# Patient Record
Sex: Female | Born: 1965 | Race: White | Hispanic: No | Marital: Married | Smoking: Current every day smoker
Health system: Southern US, Community
[De-identification: ages and names within clinical notes are randomized; demographics above are authoritative.]

## PROBLEM LIST (undated history)

## (undated) DIAGNOSIS — J449 Chronic obstructive pulmonary disease, unspecified: Secondary | ICD-10-CM

## (undated) DIAGNOSIS — G4730 Sleep apnea, unspecified: Secondary | ICD-10-CM

## (undated) DIAGNOSIS — K219 Gastro-esophageal reflux disease without esophagitis: Secondary | ICD-10-CM

## (undated) DIAGNOSIS — R112 Nausea with vomiting, unspecified: Secondary | ICD-10-CM

## (undated) DIAGNOSIS — M797 Fibromyalgia: Secondary | ICD-10-CM

## (undated) DIAGNOSIS — F419 Anxiety disorder, unspecified: Secondary | ICD-10-CM

## (undated) DIAGNOSIS — J189 Pneumonia, unspecified organism: Secondary | ICD-10-CM

## (undated) DIAGNOSIS — Z9889 Other specified postprocedural states: Secondary | ICD-10-CM

## (undated) DIAGNOSIS — M1990 Unspecified osteoarthritis, unspecified site: Secondary | ICD-10-CM

## (undated) DIAGNOSIS — R51 Headache: Secondary | ICD-10-CM

## (undated) HISTORY — PX: TUBAL LIGATION: SHX77

## (undated) HISTORY — PX: DIAGNOSTIC LAPAROSCOPY: SUR761

## (undated) HISTORY — PX: ENDOMETRIAL ABLATION W/ NOVASURE: SUR434

## (undated) HISTORY — PX: KNEE ARTHROSCOPY: SUR90

---

## 2004-04-30 ENCOUNTER — Emergency Department: Payer: Self-pay | Admitting: Emergency Medicine

## 2004-08-04 ENCOUNTER — Emergency Department: Payer: Self-pay | Admitting: Emergency Medicine

## 2005-04-18 ENCOUNTER — Emergency Department: Payer: Self-pay | Admitting: Emergency Medicine

## 2006-04-05 ENCOUNTER — Emergency Department (HOSPITAL_COMMUNITY): Admission: EM | Admit: 2006-04-05 | Discharge: 2006-04-05 | Payer: Self-pay | Admitting: Emergency Medicine

## 2007-03-12 ENCOUNTER — Other Ambulatory Visit: Payer: Self-pay

## 2007-03-12 ENCOUNTER — Ambulatory Visit: Payer: Self-pay | Admitting: Obstetrics and Gynecology

## 2007-03-18 ENCOUNTER — Ambulatory Visit: Payer: Self-pay | Admitting: Obstetrics and Gynecology

## 2007-06-19 ENCOUNTER — Emergency Department: Payer: Self-pay | Admitting: Emergency Medicine

## 2008-03-10 ENCOUNTER — Emergency Department: Payer: Self-pay | Admitting: Emergency Medicine

## 2008-11-19 ENCOUNTER — Emergency Department: Payer: Self-pay

## 2009-03-11 ENCOUNTER — Emergency Department: Payer: Self-pay | Admitting: Emergency Medicine

## 2009-06-08 ENCOUNTER — Ambulatory Visit: Payer: Self-pay | Admitting: Unknown Physician Specialty

## 2009-06-09 ENCOUNTER — Ambulatory Visit: Payer: Self-pay | Admitting: Unknown Physician Specialty

## 2010-02-25 ENCOUNTER — Encounter
Admission: RE | Admit: 2010-02-25 | Discharge: 2010-02-25 | Payer: Self-pay | Source: Home / Self Care | Attending: Orthopedic Surgery | Admitting: Orthopedic Surgery

## 2011-03-28 ENCOUNTER — Emergency Department: Payer: Self-pay | Admitting: Emergency Medicine

## 2011-06-15 ENCOUNTER — Emergency Department: Payer: Self-pay | Admitting: Emergency Medicine

## 2011-06-15 LAB — PREGNANCY, URINE: Pregnancy Test, Urine: NEGATIVE m[IU]/mL

## 2011-06-15 LAB — URINALYSIS, COMPLETE
Bacteria: NONE SEEN
Bilirubin,UR: NEGATIVE
Blood: NEGATIVE
Leukocyte Esterase: NEGATIVE
Nitrite: NEGATIVE
Ph: 6 (ref 4.5–8.0)
Protein: NEGATIVE
RBC,UR: 1 /HPF (ref 0–5)
Squamous Epithelial: 3

## 2011-07-13 ENCOUNTER — Emergency Department: Payer: Self-pay | Admitting: Emergency Medicine

## 2012-01-18 ENCOUNTER — Emergency Department: Payer: Self-pay | Admitting: Internal Medicine

## 2012-05-21 ENCOUNTER — Emergency Department: Payer: Self-pay | Admitting: Emergency Medicine

## 2012-10-01 ENCOUNTER — Other Ambulatory Visit: Payer: Self-pay | Admitting: Orthopedic Surgery

## 2012-10-15 NOTE — H&P (Signed)
Courtland Krawiec is an 47 y.o. female.   Chief Complaint: Left Shoulder Pain  HPI: Patient returns with an MRI scan showing a small partial thickness of the left shoulder subscapularis tendon, supraspinatus tendinosis with an interstitial tear at the insertion, and most importantly, medial subluxation of the biceps tendon out of the groove.  She continues have pain and popping with overhead activities.  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  When she tries to do chores she has a significant amount of left shoulder pain again especially with overhead activities.  No past medical history on file.  No past surgical history on file.  No family history on file. Social History:  has no tobacco, alcohol, and drug history on file.  Allergies: Allergies not on file  No prescriptions prior to admission    No results found for this or any previous visit (from the past 48 hour(s)). No results found.  Review of Systems  Constitutional: Negative.   HENT: Negative.   Eyes: Negative.   Respiratory: Negative.   Cardiovascular: Negative.   Gastrointestinal: Negative.   Genitourinary: Negative.   Musculoskeletal: Positive for joint pain.  Skin: Negative.   Neurological: Negative.   Endo/Heme/Allergies: Negative.   Psychiatric/Behavioral: Negative.     There were no vitals taken for this visit. Physical Exam  Constitutional: She is oriented to person, place, and time. She appears well-developed and well-nourished.  HENT:  Head: Normocephalic and atraumatic.  Neck: Normal range of motion. Neck supple.  Cardiovascular: Intact distal pulses.   Respiratory: Effort normal and breath sounds normal.  Musculoskeletal: She exhibits tenderness (left shoulder pain).  Neurological: She is alert  and oriented to person, place, and time.  Skin: Skin is warm and dry.  Psychiatric: She has a normal mood and affect. Her behavior is normal. Judgment and thought content normal.     Assessment/Plan Assess: Left shoulder pain secondary to subluxing biceps tendon and partial thickness tears of the subscapularis at the insertion and infraspinatus at the musculotendinous junction.  Plan: The patient's injury was over 2 months ago, she continues have catching popping and pain in the shoulder, probably from the medially subluxating tendon.  In addition, she does have impingement syndrome, partial thickness tears of rotator cuff tendons.  An 11 mm subacromial spur.  Arthroscopic decompression with release of the biceps tendon should give her excellent relief of pain with only some minor residual weakness.  This was discussed at length with the patient.  I am asking permission from the insurance adjuster to get this accomplished.  Postoperatively she'll probably need 6 weeks of physical therapy to regain strength in the arm.  Mayetta Castleman R 10/15/2012, 5:29 PM

## 2012-10-16 ENCOUNTER — Encounter (HOSPITAL_BASED_OUTPATIENT_CLINIC_OR_DEPARTMENT_OTHER)
Admission: RE | Disposition: A | Discharge status: Home / Self Care | Payer: Self-pay | Source: Ambulatory Visit | Attending: Orthopedic Surgery

## 2012-10-16 ENCOUNTER — Ambulatory Visit (HOSPITAL_BASED_OUTPATIENT_CLINIC_OR_DEPARTMENT_OTHER): Payer: Worker's Compensation | Admitting: Anesthesiology

## 2012-10-16 ENCOUNTER — Ambulatory Visit (HOSPITAL_BASED_OUTPATIENT_CLINIC_OR_DEPARTMENT_OTHER)
Admission: RE | Admit: 2012-10-16 | Discharge: 2012-10-16 | Disposition: A | Discharge status: Home / Self Care | Payer: Worker's Compensation | Source: Ambulatory Visit | Attending: Orthopedic Surgery | Admitting: Orthopedic Surgery

## 2012-10-16 ENCOUNTER — Encounter (HOSPITAL_BASED_OUTPATIENT_CLINIC_OR_DEPARTMENT_OTHER): Payer: Self-pay | Admitting: *Deleted

## 2012-10-16 ENCOUNTER — Encounter (HOSPITAL_BASED_OUTPATIENT_CLINIC_OR_DEPARTMENT_OTHER): Payer: Self-pay | Admitting: Anesthesiology

## 2012-10-16 DIAGNOSIS — J4489 Other specified chronic obstructive pulmonary disease: Secondary | ICD-10-CM | POA: Insufficient documentation

## 2012-10-16 DIAGNOSIS — M19019 Primary osteoarthritis, unspecified shoulder: Secondary | ICD-10-CM | POA: Insufficient documentation

## 2012-10-16 DIAGNOSIS — M25819 Other specified joint disorders, unspecified shoulder: Secondary | ICD-10-CM | POA: Insufficient documentation

## 2012-10-16 DIAGNOSIS — M67919 Unspecified disorder of synovium and tendon, unspecified shoulder: Secondary | ICD-10-CM | POA: Insufficient documentation

## 2012-10-16 DIAGNOSIS — E669 Obesity, unspecified: Secondary | ICD-10-CM | POA: Insufficient documentation

## 2012-10-16 DIAGNOSIS — J449 Chronic obstructive pulmonary disease, unspecified: Secondary | ICD-10-CM | POA: Insufficient documentation

## 2012-10-16 DIAGNOSIS — M75102 Unspecified rotator cuff tear or rupture of left shoulder, not specified as traumatic: Secondary | ICD-10-CM

## 2012-10-16 DIAGNOSIS — X58XXXA Exposure to other specified factors, initial encounter: Secondary | ICD-10-CM | POA: Insufficient documentation

## 2012-10-16 DIAGNOSIS — M719 Bursopathy, unspecified: Secondary | ICD-10-CM | POA: Insufficient documentation

## 2012-10-16 DIAGNOSIS — IMO0001 Reserved for inherently not codable concepts without codable children: Secondary | ICD-10-CM | POA: Insufficient documentation

## 2012-10-16 DIAGNOSIS — G4730 Sleep apnea, unspecified: Secondary | ICD-10-CM | POA: Insufficient documentation

## 2012-10-16 DIAGNOSIS — K219 Gastro-esophageal reflux disease without esophagitis: Secondary | ICD-10-CM | POA: Insufficient documentation

## 2012-10-16 DIAGNOSIS — M67922 Unspecified disorder of synovium and tendon, left upper arm: Secondary | ICD-10-CM

## 2012-10-16 DIAGNOSIS — Z6841 Body Mass Index (BMI) 40.0 and over, adult: Secondary | ICD-10-CM | POA: Insufficient documentation

## 2012-10-16 DIAGNOSIS — S46819A Strain of other muscles, fascia and tendons at shoulder and upper arm level, unspecified arm, initial encounter: Secondary | ICD-10-CM | POA: Insufficient documentation

## 2012-10-16 HISTORY — DX: Gastro-esophageal reflux disease without esophagitis: K21.9

## 2012-10-16 HISTORY — DX: Other specified postprocedural states: Z98.890

## 2012-10-16 HISTORY — DX: Pneumonia, unspecified organism: J18.9

## 2012-10-16 HISTORY — PX: SHOULDER ACROMIOPLASTY: SHX6093

## 2012-10-16 HISTORY — DX: Sleep apnea, unspecified: G47.30

## 2012-10-16 HISTORY — DX: Nausea with vomiting, unspecified: R11.2

## 2012-10-16 HISTORY — PX: SHOULDER ARTHROSCOPY WITH BICEPSTENOTOMY: SHX6204

## 2012-10-16 HISTORY — DX: Headache: R51

## 2012-10-16 HISTORY — DX: Anxiety disorder, unspecified: F41.9

## 2012-10-16 HISTORY — DX: Chronic obstructive pulmonary disease, unspecified: J44.9

## 2012-10-16 HISTORY — DX: Fibromyalgia: M79.7

## 2012-10-16 HISTORY — DX: Unspecified osteoarthritis, unspecified site: M19.90

## 2012-10-16 SURGERY — SHOULDER ARTHROSCOPY WITH BICEPS TENOTOMY
Anesthesia: Regional | Site: Shoulder | Laterality: Left | Wound class: Clean

## 2012-10-16 MED ORDER — BUPIVACAINE-EPINEPHRINE PF 0.5-1:200000 % IJ SOLN
INTRAMUSCULAR | Status: DC | PRN
  Administered 2012-10-16: 30 mL

## 2012-10-16 MED ORDER — FENTANYL CITRATE 0.05 MG/ML IJ SOLN
INTRAMUSCULAR | Status: DC | PRN
  Administered 2012-10-16: 100 ug via INTRAVENOUS

## 2012-10-16 MED ORDER — ONDANSETRON HCL 4 MG/2ML IJ SOLN
INTRAMUSCULAR | Status: DC | PRN
  Administered 2012-10-16: 4 mg via INTRAVENOUS

## 2012-10-16 MED ORDER — OXYCODONE HCL 5 MG/5ML PO SOLN: 5.0000 mg | Freq: Once | ORAL | Status: DC | PRN

## 2012-10-16 MED ORDER — PROMETHAZINE HCL 25 MG/ML IJ SOLN: 6.2500 mg | INTRAMUSCULAR | Status: DC | PRN

## 2012-10-16 MED ORDER — DEXTROSE 5 % IV SOLN
3.0000 g | Freq: Once | INTRAVENOUS | Status: AC
  Administered 2012-10-16: 3 g via INTRAVENOUS

## 2012-10-16 MED ORDER — SUCCINYLCHOLINE CHLORIDE 20 MG/ML IJ SOLN
INTRAMUSCULAR | Status: DC | PRN
  Administered 2012-10-16: 100 mg via INTRAVENOUS

## 2012-10-16 MED ORDER — HYDROCODONE-ACETAMINOPHEN 5-325 MG PO TABS: 1.0000 | ORAL_TABLET | Freq: Four times a day (QID) | ORAL | Status: DC | PRN

## 2012-10-16 MED ORDER — ONDANSETRON HCL 4 MG/2ML IJ SOLN: 4.0000 mg | Freq: Four times a day (QID) | INTRAMUSCULAR | Status: DC | PRN

## 2012-10-16 MED ORDER — CHLORHEXIDINE GLUCONATE 4 % EX LIQD: 60.0000 mL | Freq: Once | CUTANEOUS | Status: DC

## 2012-10-16 MED ORDER — ONDANSETRON HCL 4 MG PO TABS: 4.0000 mg | ORAL_TABLET | Freq: Four times a day (QID) | ORAL | Status: DC | PRN

## 2012-10-16 MED ORDER — PROPOFOL 10 MG/ML IV BOLUS
INTRAVENOUS | Status: DC | PRN
  Administered 2012-10-16 (×3): 50 mg via INTRAVENOUS
  Administered 2012-10-16: 200 mg via INTRAVENOUS
  Administered 2012-10-16: 50 mg via INTRAVENOUS

## 2012-10-16 MED ORDER — DEXAMETHASONE SODIUM PHOSPHATE 4 MG/ML IJ SOLN
INTRAMUSCULAR | Status: DC | PRN
  Administered 2012-10-16: 10 mg via INTRAVENOUS

## 2012-10-16 MED ORDER — FENTANYL CITRATE 0.05 MG/ML IJ SOLN
50.0000 ug | INTRAMUSCULAR | Status: DC | PRN
  Administered 2012-10-16: 100 ug via INTRAVENOUS

## 2012-10-16 MED ORDER — DEXTROSE-NACL 5-0.45 % IV SOLN: INTRAVENOUS | Status: DC

## 2012-10-16 MED ORDER — METOCLOPRAMIDE HCL 5 MG PO TABS: 5.0000 mg | ORAL_TABLET | Freq: Three times a day (TID) | ORAL | Status: DC | PRN

## 2012-10-16 MED ORDER — LIDOCAINE HCL (CARDIAC) 20 MG/ML IV SOLN
INTRAVENOUS | Status: DC | PRN
  Administered 2012-10-16: 60 mg via INTRAVENOUS

## 2012-10-16 MED ORDER — METOCLOPRAMIDE HCL 5 MG/ML IJ SOLN: 5.0000 mg | Freq: Three times a day (TID) | INTRAMUSCULAR | Status: DC | PRN

## 2012-10-16 MED ORDER — CEFAZOLIN SODIUM-DEXTROSE 2-3 GM-% IV SOLR: 2.0000 g | INTRAVENOUS | Status: DC

## 2012-10-16 MED ORDER — LACTATED RINGERS IV SOLN
INTRAVENOUS | Status: DC
  Administered 2012-10-16 (×2): via INTRAVENOUS

## 2012-10-16 MED ORDER — MIDAZOLAM HCL 2 MG/2ML IJ SOLN
1.0000 mg | INTRAMUSCULAR | Status: DC | PRN
  Administered 2012-10-16: 2 mg via INTRAVENOUS

## 2012-10-16 MED ORDER — HYDROMORPHONE HCL PF 1 MG/ML IJ SOLN: 0.2500 mg | INTRAMUSCULAR | Status: DC | PRN

## 2012-10-16 MED ORDER — OXYCODONE HCL 5 MG PO TABS: 5.0000 mg | ORAL_TABLET | Freq: Once | ORAL | Status: DC | PRN

## 2012-10-16 SURGICAL SUPPLY — 63 items
BLADE AVERAGE 25X9 (BLADE) IMPLANT
BLADE CUTTER GATOR 3.5 (BLADE) IMPLANT
BLADE GREAT WHITE 4.2 (BLADE) ×2 IMPLANT
BLADE SURG 15 STRL LF DISP TIS (BLADE) IMPLANT
BLADE SURG 15 STRL SS (BLADE)
BUR EGG 3PK/BX (BURR) IMPLANT
BUR VERTEX HOODED 4.5 (BURR) ×2 IMPLANT
CANISTER OMNI JUG 16 LITER (MISCELLANEOUS) ×2 IMPLANT
CANISTER SUCTION 2500CC (MISCELLANEOUS) IMPLANT
CANNULA 5.75X71 LONG (CANNULA) IMPLANT
CANNULA TWIST IN 8.25X7CM (CANNULA) IMPLANT
CHLORAPREP W/TINT 26ML (MISCELLANEOUS) ×2 IMPLANT
CLOTH BEACON ORANGE TIMEOUT ST (SAFETY) ×2 IMPLANT
DECANTER SPIKE VIAL GLASS SM (MISCELLANEOUS) IMPLANT
DRAPE INCISE IOBAN 66X45 STRL (DRAPES) IMPLANT
DRAPE SHOULDER BEACH CHAIR (DRAPES) ×3 IMPLANT
DRAPE STERI 35X30 U-POUCH (DRAPES) ×2 IMPLANT
ELECT REM PT RETURN 9FT ADLT (ELECTROSURGICAL) ×2
ELECTRODE REM PT RTRN 9FT ADLT (ELECTROSURGICAL) ×1 IMPLANT
GAUZE XEROFORM 1X8 LF (GAUZE/BANDAGES/DRESSINGS) ×2 IMPLANT
GLOVE BIO SURGEON STRL SZ 6.5 (GLOVE) ×1 IMPLANT
GLOVE BIO SURGEON STRL SZ7.5 (GLOVE) ×2 IMPLANT
GLOVE BIO SURGEON STRL SZ8.5 (GLOVE) ×2 IMPLANT
GLOVE BIOGEL PI IND STRL 7.0 (GLOVE) IMPLANT
GLOVE BIOGEL PI IND STRL 8 (GLOVE) ×1 IMPLANT
GLOVE BIOGEL PI IND STRL 8.5 (GLOVE) IMPLANT
GLOVE BIOGEL PI IND STRL 9 (GLOVE) ×1 IMPLANT
GLOVE BIOGEL PI INDICATOR 7.0 (GLOVE) ×1
GLOVE BIOGEL PI INDICATOR 8 (GLOVE) ×1
GLOVE BIOGEL PI INDICATOR 8.5 (GLOVE) ×1
GLOVE BIOGEL PI INDICATOR 9 (GLOVE) ×1
GOWN PREVENTION PLUS XLARGE (GOWN DISPOSABLE) ×2 IMPLANT
GOWN PREVENTION PLUS XXLARGE (GOWN DISPOSABLE) ×3 IMPLANT
IV NS IRRIG 3000ML ARTHROMATIC (IV SOLUTION) IMPLANT
NDL SAFETY ECLIPSE 18X1.5 (NEEDLE) ×1 IMPLANT
NDL SUT 6 .5 CRC .975X.05 MAYO (NEEDLE) IMPLANT
NEEDLE HYPO 18GX1.5 SHARP (NEEDLE)
NEEDLE MAYO TAPER (NEEDLE)
NS IRRIG 1000ML POUR BTL (IV SOLUTION) IMPLANT
PACK ARTHROSCOPY DSU (CUSTOM PROCEDURE TRAY) ×2 IMPLANT
PACK BASIN DAY SURGERY FS (CUSTOM PROCEDURE TRAY) ×2 IMPLANT
PASSER SUT SWANSON 36MM LOOP (INSTRUMENTS) IMPLANT
PENCIL BUTTON HOLSTER BLD 10FT (ELECTRODE) IMPLANT
SET IRRIG Y TYPE TUR BLADDER L (SET/KITS/TRAYS/PACK) ×2 IMPLANT
SLEEVE SCD COMPRESS KNEE MED (MISCELLANEOUS) ×1 IMPLANT
SLING ARM FOAM STRAP LRG (SOFTGOODS) IMPLANT
SLING ARM FOAM STRAP MED (SOFTGOODS) IMPLANT
SLING ARM IMMOBILIZER LRG (SOFTGOODS) IMPLANT
SPONGE GAUZE 4X4 12PLY (GAUZE/BANDAGES/DRESSINGS) ×2 IMPLANT
SPONGE LAP 4X18 X RAY DECT (DISPOSABLE) IMPLANT
SUCTION FRAZIER TIP 10 FR DISP (SUCTIONS) IMPLANT
SUT ETHIBOND 2 OS 4 DA (SUTURE) IMPLANT
SUT ETHILON 4 0 PS 2 18 (SUTURE) IMPLANT
SUT MNCRL AB 4-0 PS2 18 (SUTURE) IMPLANT
SUT VIC AB 3-0 PS1 18 (SUTURE)
SUT VIC AB 3-0 PS1 18XBRD (SUTURE) IMPLANT
SYR 5ML LL (SYRINGE) ×1 IMPLANT
SYR TB 1ML LL NO SAFETY (SYRINGE) IMPLANT
TAPE PAPER 3X10 WHT MICROPORE (GAUZE/BANDAGES/DRESSINGS) ×2 IMPLANT
TOWEL OR 17X24 6PK STRL BLUE (TOWEL DISPOSABLE) ×2 IMPLANT
TUBE CONNECTING 20X1/4 (TUBING) IMPLANT
WAND STAR VAC 90 (SURGICAL WAND) ×2 IMPLANT
WATER STERILE IRR 1000ML POUR (IV SOLUTION) ×2 IMPLANT

## 2012-10-16 NOTE — Progress Notes (Signed)
Assisted Dr. Massagee with left, ultrasound guided, interscalene  block. Side rails up, monitors on throughout procedure. See vital signs in flow sheet. Tolerated Procedure well. 

## 2012-10-16 NOTE — Op Note (Signed)
Preoperative diagnosis: Left shoulder impingement syndrome, a.c. joint arthritis, partial subscapularis tear with subluxation of biceps tendon out of the bicipital groove Postoperative diagnosis: Same, partial-thickness external leaflet supraspinatus tear   Procedure: Left shoulder shoulder arthroscopic anterior-inferior acromioplasty, formal distal clavicle excision, debridement external leaflet partial-thickness supraspinatus tear, biceps tenodesis.  Surgeon: Feliberto Gottron. Turner Daniels M.D.   First assistant: Shirl Harris PA-C   Anesthetic: Left shoulder interscalene block plus general endotracheal   Estimated blood loss: Minimal   Fluid replacement: 1200 cc of crystalloid.   Indications for procedure: Patient with shoulder impingement syndrome and a.c. joint arthritis, biceps tendon subluxation secondary to partial superior subscapularis insertional tear as proven by MRI scan. Patient has failed conservative measures with anti-inflammatory medicines, therapy and exercises, but did get temporary relief from a cortisone injection in the subacromial space. Because of increasing pain and weakness patient desires elective arthroscopic evaluation with acromioplasty, distal clavicle excision and we will also address any other intra-articular pathology. Risks and benefits of surgery were discussed prior to the procedure and all questions answered.   Description of procedure: Patient was identified by arm band and given preoperative IV antibiotics in the holding area, as well as interscalene block anesthetic.Marland Kitchen Patient was taken to the operating room where the appropriate anesthetic monitors were attached and general endotracheal anesthesia was induced with the patient in the supine position. Patient was then placed in the beachchair position and the left upper extremity prepped and draped in the usual sterile fashion from the wrist to the hemithorax. A time out procedure was performed. We began the operation by  making standard portals 1.5 cm anterior to the acromion, 1.5 cm lateral to the junction of the middle and posterior thirds of the acromion, and 1.5 cm posterior the posterior lateral corner of the acromion process. The inflow with gravity was placed anteriorly, the arthroscope laterally, and a 4.2 great-white sucker shaver posteriorly. The subacromial bursa was resected and we visualized the subacromial spur which was then removed with a 4.5 hooded vortex bur making 2 passes. We then visualized the arthritic a.c. joint and the inferior distal centimeter of the clavicle was resected using a 4.5 hooded vortex bur from the posterior portal. We then brought the burr anteriorly, the scope posteriorly, and the inflow laterally completing the 1 cm distal clavicle excision. Moving into the glenohumeral joint the articular and the labral cartilages were visualized and the leading insertional lens of the subscapularis tendon was noted to be deficient the biceps tendon was subluxed and out of the bicipital groove. Utilizing arthroscopic scissors we then tenodesed the biceps tendon at its origin off of the superior tubercle of the glenoid and documented disappearing into the bicipital groove. We then documented the integrity of the supraspinatus insertion as well as the infraspinatus insertion. At this point the shoulder was irrigated out normal saline solution the arthroscopic instruments removed and a dressing of Xerofoam 4 x 4 dressing sponges, paper tape, and sling applied the patient was then placed in supine awakened extubated and taken to the recovery without difficulty.

## 2012-10-16 NOTE — Interval H&P Note (Signed)
History and Physical Interval Note:  10/16/2012 11:44 AM  Madeline Church  has presented today for surgery, with the diagnosis of left shoulder bicep impingement  The various methods of treatment have been discussed with the patient and family. After consideration of risks, benefits and other options for treatment, the patient has consented to  Procedure(s): LEFT SHOULDER ARTHROSCOPY WITH BICEPSTENOTOMY (Left) LEFT SHOULDER ACROMIOPLASTY WITH DISTAL CLAVICLE EXCISION (Left) as a surgical intervention .  The patient's history has been reviewed, patient examined, no change in status, stable for surgery.  I have reviewed the patient's chart and labs.  Questions were answered to the patient's satisfaction.     Nestor Lewandowsky

## 2012-10-16 NOTE — Anesthesia Preprocedure Evaluation (Signed)
Anesthesia Evaluation  Patient identified by MRN, date of birth, ID band Patient awake    Reviewed: Allergy & Precautions, NPO status   History of Anesthesia Complications (+) PONV  Airway Mallampati: II  Neck ROM: Full    Dental   Pulmonary sleep apnea , COPDCurrent Smoker,  breath sounds clear to auscultation        Cardiovascular Rhythm:Regular Rate:Normal     Neuro/Psych  Headaches,    GI/Hepatic Neg liver ROS, GERD-  ,  Endo/Other  negative endocrine ROS  Renal/GU negative Renal ROS     Musculoskeletal  (+) Fibromyalgia -  Abdominal (+) + obese,   Peds  Hematology negative hematology ROS (+)   Anesthesia Other Findings   Reproductive/Obstetrics                           Anesthesia Physical Anesthesia Plan  ASA: III  Anesthesia Plan: General   Post-op Pain Management:    Induction: Intravenous  Airway Management Planned: Oral ETT  Additional Equipment:   Intra-op Plan:   Post-operative Plan: Extubation in OR  Informed Consent: I have reviewed the patients History and Physical, chart, labs and discussed the procedure including the risks, benefits and alternatives for the proposed anesthesia with the patient or authorized representative who has indicated his/her understanding and acceptance.   Dental advisory given  Plan Discussed with: CRNA and Surgeon  Anesthesia Plan Comments:         Anesthesia Quick Evaluation

## 2012-10-16 NOTE — Transfer of Care (Signed)
Immediate Anesthesia Transfer of Care Note  Patient: Madeline Church  Procedure(s) Performed: Procedure(s): LEFT SHOULDER ARTHROSCOPY WITH BICEPSTENOTOMY (Left) LEFT SHOULDER ACROMIOPLASTY WITH DISTAL CLAVICLE EXCISION DEBRIDEMENT OF PARTIAL ROTATOR CUFF TEAR (Left)  Patient Location: PACU  Anesthesia Type:GA combined with regional for post-op pain  Level of Consciousness: awake, alert  and oriented  Airway & Oxygen Therapy: Patient Spontanous Breathing and Patient connected to face mask oxygen  Post-op Assessment: Report given to PACU RN and Post -op Vital signs reviewed and stable  Post vital signs: Reviewed and stable  Complications: No apparent anesthesia complications

## 2012-10-16 NOTE — Anesthesia Postprocedure Evaluation (Signed)
  Anesthesia Post-op Note  Patient: Leandra Kern  Procedure(s) Performed: Procedure(s): LEFT SHOULDER ARTHROSCOPY WITH BICEPSTENOTOMY (Left) LEFT SHOULDER ACROMIOPLASTY WITH DISTAL CLAVICLE EXCISION DEBRIDEMENT OF PARTIAL ROTATOR CUFF TEAR (Left)  Patient Location: PACU  Anesthesia Type:GA combined with regional for post-op pain  Level of Consciousness: awake and alert   Airway and Oxygen Therapy: Patient Spontanous Breathing  Post-op Pain: none  Post-op Assessment: Post-op Vital signs reviewed and Patient's Cardiovascular Status Stable  Post-op Vital Signs: stable  Complications: No apparent anesthesia complications

## 2012-10-16 NOTE — Anesthesia Procedure Notes (Addendum)
Anesthesia Regional Block:  Supraclavicular block  Pre-Anesthetic Checklist: ,, timeout performed, Correct Patient, Correct Site, Correct Laterality, Correct Procedure, Correct Position, site marked, Risks and benefits discussed, Surgical consent,  At surgeon's request and post-op pain management  Laterality: Upper  Prep: chloraprep       Needles:   Needle Type: Echogenic Needle      Needle Gauge: 22 and 22 G  Needle insertion depth: 5 cm   Additional Needles:  Procedures: ultrasound guided (picture in chart) and nerve stimulator Supraclavicular block Narrative:  Start time: 10/16/2012 12:15 PM End time: 10/16/2012 12:35 PM Injection made incrementally with aspirations every 5 mL.  Performed by: Personally  Anesthesiologist: T Massagee  Additional Notes: Tolerated well   Procedure Name: Intubation Date/Time: 10/16/2012 1:02 PM Performed by: Burna Cash Pre-anesthesia Checklist: Patient identified, Emergency Drugs available, Suction available and Patient being monitored Patient Re-evaluated:Patient Re-evaluated prior to inductionOxygen Delivery Method: Circle System Utilized Preoxygenation: Pre-oxygenation with 100% oxygen Intubation Type: IV induction Ventilation: Mask ventilation without difficulty Laryngoscope Size: Mac and 3 Grade View: Grade I Tube type: Oral Tube size: 7.0 mm Number of attempts: 1 Airway Equipment and Method: stylet and oral airway Placement Confirmation: ETT inserted through vocal cords under direct vision,  positive ETCO2 and breath sounds checked- equal and bilateral Secured at: 22 cm Tube secured with: Tape Dental Injury: Teeth and Oropharynx as per pre-operative assessment

## 2012-10-17 LAB — POCT HEMOGLOBIN-HEMACUE: Hemoglobin: 17 g/dL — ABNORMAL HIGH (ref 12.0–15.0)

## 2012-10-21 ENCOUNTER — Encounter (HOSPITAL_BASED_OUTPATIENT_CLINIC_OR_DEPARTMENT_OTHER): Payer: Self-pay | Admitting: Orthopedic Surgery

## 2013-01-29 ENCOUNTER — Emergency Department: Payer: Self-pay

## 2013-01-29 LAB — RAPID INFLUENZA A&B ANTIGENS

## 2013-08-06 ENCOUNTER — Emergency Department: Payer: Self-pay | Admitting: Emergency Medicine

## 2013-08-06 LAB — COMPREHENSIVE METABOLIC PANEL
Albumin: 3.6 g/dL (ref 3.4–5.0)
Alkaline Phosphatase: 87 U/L
Anion Gap: 4 — ABNORMAL LOW (ref 7–16)
BILIRUBIN TOTAL: 0.5 mg/dL (ref 0.2–1.0)
BUN: 16 mg/dL (ref 7–18)
CREATININE: 0.79 mg/dL (ref 0.60–1.30)
Calcium, Total: 9.5 mg/dL (ref 8.5–10.1)
Chloride: 102 mmol/L (ref 98–107)
Co2: 30 mmol/L (ref 21–32)
Glucose: 91 mg/dL (ref 65–99)
OSMOLALITY: 273 (ref 275–301)
Potassium: 3.8 mmol/L (ref 3.5–5.1)
SGOT(AST): 16 U/L (ref 15–37)
SGPT (ALT): 29 U/L (ref 12–78)
SODIUM: 136 mmol/L (ref 136–145)
Total Protein: 7.9 g/dL (ref 6.4–8.2)

## 2013-08-06 LAB — CBC WITH DIFFERENTIAL/PLATELET
BASOS ABS: 0.2 10*3/uL — AB (ref 0.0–0.1)
BASOS PCT: 1.1 %
EOS ABS: 0.3 10*3/uL (ref 0.0–0.7)
Eosinophil %: 1.9 %
HCT: 47.1 % — AB (ref 35.0–47.0)
HGB: 15.5 g/dL (ref 12.0–16.0)
LYMPHS PCT: 28.8 %
Lymphocyte #: 4.3 10*3/uL — ABNORMAL HIGH (ref 1.0–3.6)
MCH: 30.2 pg (ref 26.0–34.0)
MCHC: 33 g/dL (ref 32.0–36.0)
MCV: 91 fL (ref 80–100)
MONOS PCT: 6.5 %
Monocyte #: 1 x10 3/mm — ABNORMAL HIGH (ref 0.2–0.9)
NEUTROS ABS: 9.3 10*3/uL — AB (ref 1.4–6.5)
NEUTROS PCT: 61.7 %
PLATELETS: 363 10*3/uL (ref 150–440)
RBC: 5.16 10*6/uL (ref 3.80–5.20)
RDW: 12.6 % (ref 11.5–14.5)
WBC: 15.1 10*3/uL — AB (ref 3.6–11.0)

## 2013-10-30 ENCOUNTER — Encounter (HOSPITAL_COMMUNITY): Payer: Self-pay | Admitting: Emergency Medicine

## 2013-10-30 ENCOUNTER — Emergency Department (HOSPITAL_COMMUNITY)
Admission: EM | Admit: 2013-10-30 | Discharge: 2013-10-30 | Disposition: A | Discharge status: Home / Self Care | Payer: Self-pay | Attending: Emergency Medicine | Admitting: Emergency Medicine

## 2013-10-30 ENCOUNTER — Emergency Department (HOSPITAL_COMMUNITY): Payer: No Typology Code available for payment source

## 2013-10-30 DIAGNOSIS — F411 Generalized anxiety disorder: Secondary | ICD-10-CM | POA: Insufficient documentation

## 2013-10-30 DIAGNOSIS — Z79899 Other long term (current) drug therapy: Secondary | ICD-10-CM | POA: Insufficient documentation

## 2013-10-30 DIAGNOSIS — F17200 Nicotine dependence, unspecified, uncomplicated: Secondary | ICD-10-CM | POA: Insufficient documentation

## 2013-10-30 DIAGNOSIS — Z8701 Personal history of pneumonia (recurrent): Secondary | ICD-10-CM | POA: Insufficient documentation

## 2013-10-30 DIAGNOSIS — J449 Chronic obstructive pulmonary disease, unspecified: Secondary | ICD-10-CM | POA: Insufficient documentation

## 2013-10-30 DIAGNOSIS — Y9389 Activity, other specified: Secondary | ICD-10-CM | POA: Insufficient documentation

## 2013-10-30 DIAGNOSIS — M129 Arthropathy, unspecified: Secondary | ICD-10-CM | POA: Insufficient documentation

## 2013-10-30 DIAGNOSIS — J4489 Other specified chronic obstructive pulmonary disease: Secondary | ICD-10-CM | POA: Insufficient documentation

## 2013-10-30 DIAGNOSIS — Z791 Long term (current) use of non-steroidal anti-inflammatories (NSAID): Secondary | ICD-10-CM | POA: Insufficient documentation

## 2013-10-30 DIAGNOSIS — R519 Headache, unspecified: Secondary | ICD-10-CM

## 2013-10-30 DIAGNOSIS — Y92410 Unspecified street and highway as the place of occurrence of the external cause: Secondary | ICD-10-CM | POA: Insufficient documentation

## 2013-10-30 DIAGNOSIS — R51 Headache: Secondary | ICD-10-CM

## 2013-10-30 DIAGNOSIS — K219 Gastro-esophageal reflux disease without esophagitis: Secondary | ICD-10-CM | POA: Insufficient documentation

## 2013-10-30 DIAGNOSIS — S0990XA Unspecified injury of head, initial encounter: Secondary | ICD-10-CM | POA: Insufficient documentation

## 2013-10-30 MED ORDER — HYDROCODONE-ACETAMINOPHEN 5-325 MG PO TABS: 1.0000 | ORAL_TABLET | ORAL | Status: DC | PRN

## 2013-10-30 MED ORDER — CYCLOBENZAPRINE HCL 10 MG PO TABS: 10.0000 mg | ORAL_TABLET | Freq: Three times a day (TID) | ORAL | Status: DC | PRN

## 2013-10-30 MED ORDER — OXYCODONE-ACETAMINOPHEN 5-325 MG PO TABS
1.0000 | ORAL_TABLET | Freq: Once | ORAL | Status: AC
  Administered 2013-10-30: 1 via ORAL
  Filled 2013-10-30: qty 1
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
## 2013-10-30 NOTE — ED Provider Notes (Signed)
Medical screening examination/treatment/procedure(s) were performed by non-physician practitioner and as supervising physician I was immediately available for consultation/collaboration.   EKG Interpretation None        Indea Dearman N Nessie Nong, DO 10/30/13 2307 
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
## 2014-09-01 ENCOUNTER — Emergency Department: Payer: No Typology Code available for payment source

## 2014-09-01 ENCOUNTER — Encounter: Payer: Self-pay | Admitting: Medical Oncology

## 2014-09-01 ENCOUNTER — Emergency Department
Admission: EM | Admit: 2014-09-01 | Discharge: 2014-09-01 | Disposition: A | Discharge status: Home / Self Care | Payer: No Typology Code available for payment source | Attending: Emergency Medicine | Admitting: Emergency Medicine

## 2014-09-01 DIAGNOSIS — M79605 Pain in left leg: Secondary | ICD-10-CM | POA: Insufficient documentation

## 2014-09-01 DIAGNOSIS — Z720 Tobacco use: Secondary | ICD-10-CM | POA: Insufficient documentation

## 2014-09-01 DIAGNOSIS — Z79899 Other long term (current) drug therapy: Secondary | ICD-10-CM | POA: Insufficient documentation

## 2014-09-01 DIAGNOSIS — Z791 Long term (current) use of non-steroidal anti-inflammatories (NSAID): Secondary | ICD-10-CM | POA: Diagnosis not present

## 2014-09-01 DIAGNOSIS — M79606 Pain in leg, unspecified: Secondary | ICD-10-CM

## 2014-09-01 NOTE — ED Notes (Signed)
Patient transported to Ultrasound 

## 2014-09-01 NOTE — ED Provider Notes (Signed)
[facility] Emergency Department Provider Note    ____________________________________________  Time seen: 62  I have reviewed the triage vital signs and the nursing notes.   HISTORY  Chief Complaint Leg Pain   History limited by: Not Limited   HPI Madeline Church is a 49 y.o. female who presents to the emergency department today because of left calf swelling and pain. This started 3 days ago. It has been consistent since then. She states it started with a cramping. She was not doing any unusual activity prior to the pain starting. She denies any recent sick travel. Denies any chest pain or shortness of breath. Denies any fevers.  Past Medical History  Diagnosis Date  . PONV (postoperative nausea and vomiting)     mother  . Anxiety   . Pneumonia     last time 4 years ago  . Sleep apnea   . GERD (gastroesophageal reflux disease)   . Headache(784.0)     migraines  . Fibromyalgia     osteoarthritis  . Arthritis   . COPD (chronic obstructive pulmonary disease)     emphysema 2009    There are no active problems to display for this patient.   Past Surgical History  Procedure Laterality Date  . Diagnostic laparoscopy      ovarian cysts  . Tubal ligation    . Knee arthroscopy Left   . Cesarean section    . Endometrial ablation w/ novasure    . Shoulder arthroscopy with bicepstenotomy Left 10/16/2012    Procedure: LEFT SHOULDER ARTHROSCOPY WITH BICEPSTENOTOMY;  Surgeon: Nestor Lewandowsky, MD;  Location: [facility];  Service: Orthopedics;  Laterality: Left;  . Shoulder acromioplasty Left 10/16/2012    Procedure: LEFT SHOULDER ACROMIOPLASTY WITH DISTAL CLAVICLE EXCISION DEBRIDEMENT OF PARTIAL ROTATOR CUFF TEAR;  Surgeon: Nestor Lewandowsky, MD;  Location: [facility];  Service: Orthopedics;  Laterality: Left;    Current Outpatient Rx  Name  Route  Sig  Dispense  Refill  . cyclobenzaprine (FLEXERIL) 10 MG tablet   Oral   Take 1  tablet (10 mg total) by mouth 3 (three) times daily as needed for muscle spasms (or pain).   15 tablet   0   . diclofenac (VOLTAREN) 75 MG EC tablet   Oral   Take 75 mg by mouth 2 (two) times daily.         Marland Kitchen HYDROcodone-acetaminophen (NORCO) 5-325 MG per tablet   Oral   Take 1 tablet by mouth every 6 (six) hours as needed for pain.   60 tablet   0   . HYDROcodone-acetaminophen (NORCO/VICODIN) 5-325 MG per tablet   Oral   Take 1 tablet by mouth every 4 (four) hours as needed.   10 tablet   0   . omeprazole (PRILOSEC) 20 MG capsule   Oral   Take 20 mg by mouth daily.         . traMADol (ULTRAM) 50 MG tablet   Oral   Take 50 mg by mouth every 6 (six) hours as needed for pain.           Allergies Review of patient's allergies indicates no known allergies.  No family history on file.  Social History History  Substance Use Topics  . Smoking status: Current Every Day Smoker -- 0.50 packs/day for 20 years  . Smokeless tobacco: Not on file  . Alcohol Use: No    Review of Systems  Constitutional: Negative for fever.  Cardiovascular: Negative for chest pain. Respiratory: Negative for shortness of breath. Gastrointestinal: Negative for abdominal pain, vomiting and diarrhea. Genitourinary: Negative for dysuria. Musculoskeletal: Negative for back pain. Left calf swelling and pain Skin: Negative for rash. Neurological: Negative for headaches, focal weakness or numbness.   10-point ROS otherwise negative.  ____________________________________________   PHYSICAL EXAM:  VITAL SIGNS: ED Triage Vitals  Enc Vitals Group     BP 09/01/14 1656 132/56 mmHg     Pulse Rate 09/01/14 1656 81     Resp 09/01/14 1656 20     Temp 09/01/14 1656 98.1 F (36.7 C)     Temp Source 09/01/14 1656 Oral     SpO2 09/01/14 1656 95 %     Weight 09/01/14 1656 298 lb 5 oz (135.314 kg)     Height 09/01/14 1656 5\' 7"  (1.702 m)     Head Cir --      Peak Flow --      Pain Score  09/01/14 1704 7   Constitutional: Alert and oriented. Well appearing and in no distress. Eyes: Conjunctivae are normal. PERRL. Normal extraocular movements. ENT   Head: Normocephalic and atraumatic.   Nose: No congestion/rhinnorhea.   Mouth/Throat: Mucous membranes are moist.   Neck: No stridor. Hematological/Lymphatic/Immunilogical: No cervical lymphadenopathy. Cardiovascular: Normal rate, regular rhythm.  No murmurs, rubs, or gallops. Respiratory: Normal respiratory effort without tachypnea nor retractions. Breath sounds are clear and equal bilaterally. No wheezes/rales/rhonchi. Gastrointestinal: Soft and nontender. No distention. There is no CVA tenderness. Genitourinary: Deferred Musculoskeletal: Normal range of motion in all extremities. No joint effusions.  Left calf with possibly slightly greater diameter than right calf, left calf is tender to palpation. No palpable cords. No skin changes. Neurovascularly intact distally. Neurologic:  Normal speech and language. No gross focal neurologic deficits are appreciated. Speech is normal.  Skin:  Skin is warm, dry and intact. No rash noted. Psychiatric: Mood and affect are normal. Speech and behavior are normal. Patient exhibits appropriate insight and judgment.  ____________________________________________    LABS (pertinent positives/negatives)  None  ____________________________________________   EKG  None  ____________________________________________    RADIOLOGY  Ultrasound lower extremity Doppler IMPRESSION: 1. No evidence of lower extremity deep vein thrombosis, LEFT. ____________________________________________   PROCEDURES  Procedure(s) performed: None  Critical Care performed: No  ____________________________________________   INITIAL IMPRESSION / ASSESSMENT AND PLAN / ED COURSE  Pertinent labs & imaging results that were available during my care of the patient were reviewed by me and  considered in my medical decision making (see chart for details).  Patient presents to the emergency Department with complaints of left calf pain and swelling. On exam mild tenderness to palpation of the calf. Ultrasound Doppler was done which did not show any blood clot. Will plan on discharging home.  ____________________________________________   FINAL CLINICAL IMPRESSION(S) / ED DIAGNOSES  Final diagnoses:  Leg pain     Phineas SemenGraydon Cadan Maggart, MD 09/01/14 1946

## 2014-09-01 NOTE — ED Notes (Signed)
Pt reports that she had left leg cramping Saturday- pt began having pain to lt calf Sunday with swelling. Today pt reports that she is now having pain to both legs.

## 2014-09-01 NOTE — Discharge Instructions (Signed)
Please seek medical attention for any high fevers, chest pain, shortness of breath, change in behavior, persistent vomiting, bloody stool or any other new or concerning symptoms. ° °Musculoskeletal Pain °Musculoskeletal pain is muscle and boney aches and pains. These pains can occur in any part of the body. Your caregiver may treat you without knowing the cause of the pain. They may treat you if blood or urine tests, X-rays, and other tests were normal.  °CAUSES °There is often not a definite cause or reason for these pains. These pains may be caused by a type of germ (virus). The discomfort may also come from overuse. Overuse includes working out too hard when your body is not fit. Boney aches also come from weather changes. Bone is sensitive to atmospheric pressure changes. °HOME CARE INSTRUCTIONS  °· Ask when your test results will be ready. Make sure you get your test results. °· Only take over-the-counter or prescription medicines for pain, discomfort, or fever as directed by your caregiver. If you were given medications for your condition, do not drive, operate machinery or power tools, or sign legal documents for 24 hours. Do not drink alcohol. Do not take sleeping pills or other medications that may interfere with treatment. °· Continue all activities unless the activities cause more pain. When the pain lessens, slowly resume normal activities. Gradually increase the intensity and duration of the activities or exercise. °· During periods of severe pain, bed rest may be helpful. Lay or sit in any position that is comfortable. °· Putting ice on the injured area. °¨ Put ice in a bag. °¨ Place a towel between your skin and the bag. °¨ Leave the ice on for 15 to 20 minutes, 3 to 4 times a day. °· Follow up with your caregiver for continued problems and no reason can be found for the pain. If the pain becomes worse or does not go away, it may be necessary to repeat tests or do additional testing. Your caregiver  may need to look further for a possible cause. °SEEK IMMEDIATE MEDICAL CARE IF: °· You have pain that is getting worse and is not relieved by medications. °· You develop chest pain that is associated with shortness or breath, sweating, feeling sick to your stomach (nauseous), or throw up (vomit). °· Your pain becomes localized to the abdomen. °· You develop any new symptoms that seem different or that concern you. °MAKE SURE YOU:  °· Understand these instructions. °· Will watch your condition. °· Will get help right away if you are not doing well or get worse. °Document Released: 01/30/2005 Document Revised: 04/24/2011 Document Reviewed: 10/04/2012 °[employer]® Patient Information ©2015 [employer]. This information is not intended to replace advice given to you by your health care provider. Make sure you discuss any questions you have with your health care provider. ° °

## 2014-09-17 ENCOUNTER — Inpatient Hospital Stay
Admission: EM | Admit: 2014-09-17 | Discharge: 2014-09-22 | DRG: 189 | Disposition: A | Discharge status: Home / Self Care | Payer: No Typology Code available for payment source | Attending: Internal Medicine | Admitting: Internal Medicine

## 2014-09-17 ENCOUNTER — Emergency Department: Payer: No Typology Code available for payment source

## 2014-09-17 DIAGNOSIS — Z8249 Family history of ischemic heart disease and other diseases of the circulatory system: Secondary | ICD-10-CM

## 2014-09-17 DIAGNOSIS — G4700 Insomnia, unspecified: Secondary | ICD-10-CM | POA: Diagnosis present

## 2014-09-17 DIAGNOSIS — M1990 Unspecified osteoarthritis, unspecified site: Secondary | ICD-10-CM | POA: Diagnosis present

## 2014-09-17 DIAGNOSIS — M797 Fibromyalgia: Secondary | ICD-10-CM | POA: Diagnosis present

## 2014-09-17 DIAGNOSIS — J9601 Acute respiratory failure with hypoxia: Secondary | ICD-10-CM | POA: Diagnosis not present

## 2014-09-17 DIAGNOSIS — J40 Bronchitis, not specified as acute or chronic: Secondary | ICD-10-CM

## 2014-09-17 DIAGNOSIS — J9600 Acute respiratory failure, unspecified whether with hypoxia or hypercapnia: Secondary | ICD-10-CM

## 2014-09-17 DIAGNOSIS — F329 Major depressive disorder, single episode, unspecified: Secondary | ICD-10-CM | POA: Diagnosis present

## 2014-09-17 DIAGNOSIS — F17210 Nicotine dependence, cigarettes, uncomplicated: Secondary | ICD-10-CM | POA: Diagnosis present

## 2014-09-17 DIAGNOSIS — Z79899 Other long term (current) drug therapy: Secondary | ICD-10-CM | POA: Diagnosis not present

## 2014-09-17 DIAGNOSIS — K219 Gastro-esophageal reflux disease without esophagitis: Secondary | ICD-10-CM | POA: Diagnosis present

## 2014-09-17 DIAGNOSIS — Z9851 Tubal ligation status: Secondary | ICD-10-CM

## 2014-09-17 DIAGNOSIS — Z716 Tobacco abuse counseling: Secondary | ICD-10-CM | POA: Diagnosis present

## 2014-09-17 DIAGNOSIS — J449 Chronic obstructive pulmonary disease, unspecified: Secondary | ICD-10-CM

## 2014-09-17 DIAGNOSIS — Z9889 Other specified postprocedural states: Secondary | ICD-10-CM

## 2014-09-17 DIAGNOSIS — G43909 Migraine, unspecified, not intractable, without status migrainosus: Secondary | ICD-10-CM | POA: Diagnosis present

## 2014-09-17 DIAGNOSIS — J441 Chronic obstructive pulmonary disease with (acute) exacerbation: Secondary | ICD-10-CM | POA: Diagnosis present

## 2014-09-17 DIAGNOSIS — J9801 Acute bronchospasm: Secondary | ICD-10-CM

## 2014-09-17 DIAGNOSIS — T380X5A Adverse effect of glucocorticoids and synthetic analogues, initial encounter: Secondary | ICD-10-CM | POA: Diagnosis present

## 2014-09-17 DIAGNOSIS — G4730 Sleep apnea, unspecified: Secondary | ICD-10-CM | POA: Diagnosis present

## 2014-09-17 DIAGNOSIS — R0602 Shortness of breath: Secondary | ICD-10-CM

## 2014-09-17 DIAGNOSIS — Z833 Family history of diabetes mellitus: Secondary | ICD-10-CM | POA: Diagnosis not present

## 2014-09-17 DIAGNOSIS — R0902 Hypoxemia: Secondary | ICD-10-CM

## 2014-09-17 DIAGNOSIS — I10 Essential (primary) hypertension: Secondary | ICD-10-CM | POA: Diagnosis present

## 2014-09-17 DIAGNOSIS — J189 Pneumonia, unspecified organism: Secondary | ICD-10-CM | POA: Diagnosis present

## 2014-09-17 LAB — CBC
HEMATOCRIT: 43.3 % (ref 35.0–47.0)
HEMOGLOBIN: 14.4 g/dL (ref 12.0–16.0)
MCH: 29.9 pg (ref 26.0–34.0)
MCHC: 33.2 g/dL (ref 32.0–36.0)
MCV: 90.1 fL (ref 80.0–100.0)
PLATELETS: 323 10*3/uL (ref 150–440)
RBC: 4.81 MIL/uL (ref 3.80–5.20)
RDW: 13.2 % (ref 11.5–14.5)
WBC: 16.1 10*3/uL — ABNORMAL HIGH (ref 3.6–11.0)

## 2014-09-17 LAB — COMPREHENSIVE METABOLIC PANEL
ALBUMIN: 3.5 g/dL (ref 3.5–5.0)
ALT: 20 U/L (ref 14–54)
AST: 21 U/L (ref 15–41)
Alkaline Phosphatase: 75 U/L (ref 38–126)
Anion gap: 9 (ref 5–15)
BUN: 14 mg/dL (ref 6–20)
CALCIUM: 8.9 mg/dL (ref 8.9–10.3)
CHLORIDE: 102 mmol/L (ref 101–111)
CO2: 27 mmol/L (ref 22–32)
CREATININE: 0.82 mg/dL (ref 0.44–1.00)
GFR calc Af Amer: >60 mL/min (ref >60)
Glucose, Bld: 96 mg/dL (ref 65–99)
POTASSIUM: 4.1 mmol/L (ref 3.5–5.1)
SODIUM: 138 mmol/L (ref 135–145)
Total Bilirubin: 0.6 mg/dL (ref 0.3–1.2)
Total Protein: 7.4 g/dL (ref 6.5–8.1)

## 2014-09-17 LAB — TROPONIN I

## 2014-09-17 LAB — BRAIN NATRIURETIC PEPTIDE: B NATRIURETIC PEPTIDE 5: 35 pg/mL (ref 0.0–100.0)

## 2014-09-17 MED ORDER — IPRATROPIUM-ALBUTEROL 0.5-2.5 (3) MG/3ML IN SOLN
RESPIRATORY_TRACT | Status: AC
  Administered 2014-09-17: 14:00:00 3 mL via RESPIRATORY_TRACT
  Filled 2014-09-17: qty 3

## 2014-09-17 MED ORDER — BUDESONIDE 0.25 MG/2ML IN SUSP
0.2500 mg | Freq: Two times a day (BID) | RESPIRATORY_TRACT | Status: DC
  Administered 2014-09-17: 0.25 mg via RESPIRATORY_TRACT
  Filled 2014-09-17 (×2): qty 2

## 2014-09-17 MED ORDER — LISINOPRIL 20 MG PO TABS
20.0000 mg | ORAL_TABLET | Freq: Every day | ORAL | Status: DC
  Administered 2014-09-17 – 2014-09-22 (×5): 20 mg via ORAL
  Filled 2014-09-17 (×6): qty 1

## 2014-09-17 MED ORDER — PANTOPRAZOLE SODIUM 40 MG PO TBEC
40.0000 mg | DELAYED_RELEASE_TABLET | Freq: Every day | ORAL | Status: DC
  Administered 2014-09-18 – 2014-09-22 (×5): 40 mg via ORAL
  Filled 2014-09-17 (×5): qty 1

## 2014-09-17 MED ORDER — ALBUTEROL SULFATE (2.5 MG/3ML) 0.083% IN NEBU
2.5000 mg | INHALATION_SOLUTION | Freq: Once | RESPIRATORY_TRACT | Status: AC
  Administered 2014-09-17: 2.5 mg via RESPIRATORY_TRACT
  Filled 2014-09-17: qty 3

## 2014-09-17 MED ORDER — LISINOPRIL-HYDROCHLOROTHIAZIDE 20-12.5 MG PO TABS: 1.0000 | ORAL_TABLET | Freq: Every day | ORAL | Status: DC

## 2014-09-17 MED ORDER — BUDESONIDE 0.25 MG/2ML IN SUSP
0.2500 mg | Freq: Two times a day (BID) | RESPIRATORY_TRACT | Status: DC
  Administered 2014-09-17 – 2014-09-22 (×10): 0.25 mg via RESPIRATORY_TRACT
  Filled 2014-09-17 (×9): qty 2

## 2014-09-17 MED ORDER — LEVOFLOXACIN 500 MG PO TABS
500.0000 mg | ORAL_TABLET | Freq: Every day | ORAL | Status: DC
  Administered 2014-09-17 – 2014-09-18 (×2): 500 mg via ORAL
  Filled 2014-09-17 (×2): qty 1

## 2014-09-17 MED ORDER — HEPARIN SODIUM (PORCINE) 5000 UNIT/ML IJ SOLN
5000.0000 [IU] | Freq: Three times a day (TID) | INTRAMUSCULAR | Status: DC
  Administered 2014-09-17 – 2014-09-22 (×14): 5000 [IU] via SUBCUTANEOUS
  Filled 2014-09-17 (×14): qty 1

## 2014-09-17 MED ORDER — METHYLPREDNISOLONE SODIUM SUCC 125 MG IJ SOLR
125.0000 mg | Freq: Once | INTRAMUSCULAR | Status: AC
  Administered 2014-09-17: 125 mg via INTRAVENOUS
  Filled 2014-09-17: qty 2

## 2014-09-17 MED ORDER — METHYLPREDNISOLONE SODIUM SUCC 125 MG IJ SOLR
60.0000 mg | Freq: Four times a day (QID) | INTRAMUSCULAR | Status: DC
  Administered 2014-09-17 – 2014-09-18 (×4): 60 mg via INTRAVENOUS
  Administered 2014-09-18: 62.5 mg via INTRAVENOUS
  Administered 2014-09-18: 60 mg via INTRAVENOUS
  Administered 2014-09-19: 0.96 mg via INTRAVENOUS
  Administered 2014-09-19: 60 mg via INTRAVENOUS
  Filled 2014-09-17 (×8): qty 2

## 2014-09-17 MED ORDER — IPRATROPIUM-ALBUTEROL 0.5-2.5 (3) MG/3ML IN SOLN
3.0000 mL | RESPIRATORY_TRACT | Status: DC
  Administered 2014-09-17 – 2014-09-22 (×30): 3 mL via RESPIRATORY_TRACT
  Filled 2014-09-17 (×30): qty 3

## 2014-09-17 MED ORDER — HYDROCHLOROTHIAZIDE 12.5 MG PO CAPS
12.5000 mg | ORAL_CAPSULE | Freq: Every day | ORAL | Status: DC
  Administered 2014-09-17 – 2014-09-22 (×6): 12.5 mg via ORAL
  Filled 2014-09-17 (×7): qty 1

## 2014-09-17 MED ORDER — LORATADINE 10 MG PO TABS
10.0000 mg | ORAL_TABLET | Freq: Every day | ORAL | Status: DC
  Administered 2014-09-18 – 2014-09-22 (×5): 10 mg via ORAL
  Filled 2014-09-17 (×6): qty 1

## 2014-09-17 MED ORDER — IPRATROPIUM-ALBUTEROL 0.5-2.5 (3) MG/3ML IN SOLN
3.0000 mL | Freq: Once | RESPIRATORY_TRACT | Status: AC
  Administered 2014-09-17: 3 mL via RESPIRATORY_TRACT

## 2014-09-17 NOTE — ED Provider Notes (Signed)
[facility] Emergency Department Provider Note  ____________________________________________  Time seen: On arrival  I have reviewed the triage vital signs and the nursing notes.   HISTORY  Chief Complaint Shortness of Breath    HPI Madeline Church is a 49 y.o. female who presents with cough and shortness of breath. She reports she's had a severe cough for the last 3 days and over the last 2 days as become difficult for her to breathe. She's never had this before. She reports she just quit smoking one week ago. No history of asthma. She reports fevers and chills subjectively. She does have some chest tightness. No recent travel no calf tenderness swelling. No lower extremity edema     Past Medical History  Diagnosis Date  . PONV (postoperative nausea and vomiting)     mother  . Anxiety   . Pneumonia     last time 4 years ago  . Sleep apnea   . GERD (gastroesophageal reflux disease)   . Headache(784.0)     migraines  . Fibromyalgia     osteoarthritis  . Arthritis   . COPD (chronic obstructive pulmonary disease)     emphysema 2009    There are no active problems to display for this patient.   Past Surgical History  Procedure Laterality Date  . Diagnostic laparoscopy      ovarian cysts  . Tubal ligation    . Knee arthroscopy Left   . Cesarean section    . Endometrial ablation w/ novasure    . Shoulder arthroscopy with bicepstenotomy Left 10/16/2012    Procedure: LEFT SHOULDER ARTHROSCOPY WITH BICEPSTENOTOMY;  Surgeon: Nestor Lewandowsky, MD;  Location: [facility];  Service: Orthopedics;  Laterality: Left;  . Shoulder acromioplasty Left 10/16/2012    Procedure: LEFT SHOULDER ACROMIOPLASTY WITH DISTAL CLAVICLE EXCISION DEBRIDEMENT OF PARTIAL ROTATOR CUFF TEAR;  Surgeon: Nestor Lewandowsky, MD;  Location: [facility];  Service: Orthopedics;  Laterality: Left;    Current Outpatient Rx  Name  Route  Sig  Dispense  Refill  .  cyclobenzaprine (FLEXERIL) 10 MG tablet   Oral   Take 1 tablet (10 mg total) by mouth 3 (three) times daily as needed for muscle spasms (or pain).   15 tablet   0   . diclofenac (VOLTAREN) 75 MG EC tablet   Oral   Take 75 mg by mouth 2 (two) times daily.         Marland Kitchen HYDROcodone-acetaminophen (NORCO) 5-325 MG per tablet   Oral   Take 1 tablet by mouth every 6 (six) hours as needed for pain.   60 tablet   0   . HYDROcodone-acetaminophen (NORCO/VICODIN) 5-325 MG per tablet   Oral   Take 1 tablet by mouth every 4 (four) hours as needed.   10 tablet   0   . omeprazole (PRILOSEC) 20 MG capsule   Oral   Take 20 mg by mouth daily.         . traMADol (ULTRAM) 50 MG tablet   Oral   Take 50 mg by mouth every 6 (six) hours as needed for pain.           Allergies Review of patient's allergies indicates no known allergies.  No family history on file.  Social History History  Substance Use Topics  . Smoking status: Current Every Day Smoker -- 0.50 packs/day for 20 years  . Smokeless tobacco: Not on file  . Alcohol Use: No  Review of Systems  Constitutional: Positive for fever. Eyes: Negative for visual changes. ENT: Negative for sore throat Cardiovascular: Positive for chest tightness Respiratory: Positive for shortness of breath. Gastrointestinal: Negative for abdominal pain, vomiting and diarrhea. Genitourinary: Negative for dysuria. Musculoskeletal: Negative for back pain. Skin: Negative for rash. Neurological: Negative for headaches or focal weakness Psychiatric: No anxiety    ____________________________________________   PHYSICAL EXAM:  VITAL SIGNS: ED Triage Vitals  Enc Vitals Group     BP 09/17/14 1338 148/93 mmHg     Pulse Rate 09/17/14 1338 105     Resp 09/17/14 1401 20     Temp 09/17/14 1348 98.4 F (36.9 C)     Temp Source 09/17/14 1348 Oral     SpO2 09/17/14 1338 89 %     Weight --      Height --      Head Cir --      Peak Flow --       Pain Score --      Pain Loc --      Pain Edu? --      Excl. in GC? --      Constitutional: Alert and oriented. No acute distress Eyes: Conjunctivae are normal.  ENT   Head: Normocephalic and atraumatic.   Mouth/Throat: Mucous membranes are moist. Cardiovascular: Tachycardia regular rhythm. Normal and symmetric distal pulses are present in all extremities. No murmurs, rubs, or gallops. Respiratory: Wheezes heard diffusely. Bibasilar rales, mild tachypnea Gastrointestinal: Soft and non-tender in all quadrants. No distention. There is no CVA tenderness. Genitourinary: deferred Musculoskeletal: Nontender with normal range of motion in all extremities. No lower extremity tenderness nor edema. Neurologic:  Normal speech and language. No gross focal neurologic deficits are appreciated. Skin:  Skin is warm, dry and intact. No rash noted. Psychiatric: Mood and affect are normal. Patient exhibits appropriate insight and judgment.  ____________________________________________    LABS (pertinent positives/negatives)  Labs Reviewed  CBC  COMPREHENSIVE METABOLIC PANEL  BRAIN NATRIURETIC PEPTIDE  TROPONIN I    ____________________________________________   EKG  ED ECG REPORT I, Jene Every, the attending physician, personally viewed and interpreted this ECG.  Date: 09/17/2014 EKG Time: 1:49 PM Rate: 86 Rhythm: normal sinus rhythm QRS Axis: normal Intervals: normal ST/T Wave abnormalities: normal Conduction Disutrbances: none Narrative Interpretation: unremarkable   ____________________________________________    RADIOLOGY I have personally reviewed any xrays that were ordered on this patient: Chest x-ray unremarkable  ____________________________________________   PROCEDURES  Procedure(s) performed: none  Critical Care performed: none  ____________________________________________   INITIAL IMPRESSION / ASSESSMENT AND PLAN / ED COURSE  Pertinent  labs & imaging results that were available during my care of the patient were reviewed by me and considered in my medical decision making (see chart for details).  Patient's initial presentation concerning for pneumonia versus bronchospasm or both. Patient is requiring nasal cannula O2 .We will give a DuoNeb, check blood work x-ray and reevaluate  ----------------------------------------- 2:59 PM on 09/17/2014 -----------------------------------------  X-ray does not show pneumonia. Suspect bronchospasm as cause of symptoms. We will give additional Neb and soluMedrol and we will reevaluate.  ____________________________________________   FINAL CLINICAL IMPRESSION(S) / ED DIAGNOSES  Final diagnoses:  Bronchospasm  Hypoxia  Bronchitis     Jene Every, MD 09/17/14 1501

## 2014-09-17 NOTE — ED Notes (Signed)
Pts oxygen saturation 87% on room air, pt placed on 2L via [place].

## 2014-09-17 NOTE — ED Notes (Signed)
Patient reports illness with cough this week. Reports shortness of breath last couple of days. Patient sounds congested in chest.

## 2014-09-17 NOTE — H&P (Signed)
[facility] - [place] at [facility]   PATIENT NAME: Madeline Church    MR#:  [id number]  DATE OF BIRTH:  23-Nov-1965  DATE OF ADMISSION:  09/17/2014  PRIMARY CARE PHYSICIAN: Pcp Not In System   REQUESTING/REFERRING PHYSICIAN: Schaevitz  CHIEF COMPLAINT:   Chief Complaint  Patient presents with  . Shortness of Breath    HISTORY OF PRESENT ILLNESS: Madeline Church  is a 49 y.o. female with a known history of depression as well as esophageal reflux disease, sleep apnea, chronic smoker she's never officially diagnosed with COPD but last 1 week she started feeling coughing more which is gradually getting worse for last 3 days her shortness of breath is gradually getting worse. She could not go to work for last 3 days because of severe shortness of breath and coughing could walk small distances but then has to stop to catch her breath, so came to emergency room. She was noted to have oxygen saturation of to a high 80s on room air with some wheezing so given nebulizer treatment but even after that she continued to require 2-3 L oxygen supplementation to maintain oxygen saturation in 91 to 92. She denies any fever and has minimal sputum production.  PAST MEDICAL HISTORY:   Past Medical History  Diagnosis Date  . PONV (postoperative nausea and vomiting)     mother  . Anxiety   . Pneumonia     last time 4 years ago  . Sleep apnea   . GERD (gastroesophageal reflux disease)   . Headache(784.0)     migraines  . Fibromyalgia     osteoarthritis  . Arthritis   . COPD (chronic obstructive pulmonary disease)     emphysema 2009    PAST SURGICAL HISTORY:  Past Surgical History  Procedure Laterality Date  . Diagnostic laparoscopy      ovarian cysts  . Tubal ligation    . Knee arthroscopy Left   . Cesarean section    . Endometrial ablation w/ novasure    . Shoulder arthroscopy with bicepstenotomy Left 10/16/2012    Procedure: LEFT SHOULDER ARTHROSCOPY WITH BICEPSTENOTOMY;   Surgeon: Nestor Lewandowsky, MD;  Location: [facility];  Service: Orthopedics;  Laterality: Left;  . Shoulder acromioplasty Left 10/16/2012    Procedure: LEFT SHOULDER ACROMIOPLASTY WITH DISTAL CLAVICLE EXCISION DEBRIDEMENT OF PARTIAL ROTATOR CUFF TEAR;  Surgeon: Nestor Lewandowsky, MD;  Location: [facility];  Service: Orthopedics;  Laterality: Left;    SOCIAL HISTORY:  History  Substance Use Topics  . Smoking status: Current Every Day Smoker -- 0.50 packs/day for 20 years  . Smokeless tobacco: Not on file  . Alcohol Use: No    FAMILY HISTORY:  Family History  Problem Relation Age of Onset  . Heart attack Father   . Diabetes Mother   . Diabetes Brother     DRUG ALLERGIES: No Known Allergies  REVIEW OF SYSTEMS:   CONSTITUTIONAL: No fever, fatigue or weakness.  EYES: No blurred or double vision.  EARS, NOSE, AND THROAT: No tinnitus or ear pain.  RESPIRATORY: positive for cough & shortness of breath,mild wheezing , no hemoptysis.  CARDIOVASCULAR: No chest pain, orthopnea, edema.  GASTROINTESTINAL: No nausea, vomiting, diarrhea or abdominal pain.  GENITOURINARY: No dysuria, hematuria.  ENDOCRINE: No polyuria, nocturia,  HEMATOLOGY: No anemia, easy bruising or bleeding SKIN: No rash or lesion. MUSCULOSKELETAL: No joint pain or arthritis.   NEUROLOGIC: No tingling, numbness, weakness.  PSYCHIATRY: No anxiety or depression.  MEDICATIONS AT HOME:  Prior to Admission medications   Medication Sig Start Date End Date Taking? Authorizing Provider  cetirizine (ZYRTEC) 10 MG tablet Take 10 mg by mouth at bedtime.   Yes Historical Provider, MD  lisinopril-hydrochlorothiazide (PRINZIDE,ZESTORETIC) 20-12.5 MG per tablet Take 1 tablet by mouth at bedtime.   Yes Historical Provider, MD  omeprazole (PRILOSEC) 20 MG capsule Take 20 mg by mouth at bedtime.    Yes Historical Provider, MD      PHYSICAL EXAMINATION:   VITAL SIGNS: Blood pressure 121/62, pulse 91,  temperature 98.4 F (36.9 C), temperature source Oral, resp. rate 32, last menstrual period 06/23/2014, SpO2 87 %.  GENERAL:  49 y.o.-year-old patient lying in the bed with no acute distress.  EYES: Pupils equal, round, reactive to light and accommodation. No scleral icterus. Extraocular muscles intact.  HEENT: Head atraumatic, normocephalic. Oropharynx and nasopharynx clear.  NECK:  Supple, no jugular venous distention. No thyroid enlargement, no tenderness.  LUNGS: Equal breath sounds bilaterally, mild wheezing, no crepitation. No use of accessory muscles of respiration.  CARDIOVASCULAR: S1, S2 normal. No murmurs, rubs, or gallops.  ABDOMEN: Soft, nontender, nondistended. Bowel sounds present. No organomegaly or mass.  EXTREMITIES: No pedal edema, cyanosis, or clubbing.  NEUROLOGIC: Cranial nerves II through XII are intact. Muscle strength 5/5 in all extremities. Sensation intact. Gait not checked.  PSYCHIATRIC: The patient is alert and oriented x 3.  SKIN: No obvious rash, lesion, or ulcer.   LABORATORY PANEL:   CBC  Recent Labs Lab 09/17/14 1400  WBC 16.1*  HGB 14.4  HCT 43.3  PLT 323  MCV 90.1  MCH 29.9  MCHC 33.2  RDW 13.2   ------------------------------------------------------------------------------------------------------------------  Chemistries   Recent Labs Lab 09/17/14 1400  NA 138  K 4.1  CL 102  CO2 27  GLUCOSE 96  BUN 14  CREATININE 0.82  CALCIUM 8.9  AST 21  ALT 20  ALKPHOS 75  BILITOT 0.6   ------------------------------------------------------------------------------------------------------------------ CrCl cannot be calculated (Unknown ideal weight.). ------------------------------------------------------------------------------------------------------------------ No results for input(s): TSH, T4TOTAL, T3FREE, THYROIDAB in the last 72 hours.  Invalid input(s): FREET3   Coagulation profile No results for input(s): INR, PROTIME in the  last 168 hours. ------------------------------------------------------------------------------------------------------------------- No results for input(s): DDIMER in the last 72 hours. -------------------------------------------------------------------------------------------------------------------  Cardiac Enzymes  Recent Labs Lab 09/17/14 1400  TROPONINI <0.03   ------------------------------------------------------------------------------------------------------------------ Invalid input(s): POCBNP  ---------------------------------------------------------------------------------------------------------------  Urinalysis    Component Value Date/Time   COLORURINE Straw 06/15/2011 0848   APPEARANCEUR Clear 06/15/2011 0848   LABSPEC 1.012 06/15/2011 0848   PHURINE 6.0 06/15/2011 0848   GLUCOSEU Negative 06/15/2011 0848   HGBUR Negative 06/15/2011 0848   BILIRUBINUR Negative 06/15/2011 0848   KETONESUR Negative 06/15/2011 0848   PROTEINUR Negative 06/15/2011 0848   NITRITE Negative 06/15/2011 0848   LEUKOCYTESUR Negative 06/15/2011 0848     RADIOLOGY: Dg Chest 2 View  09/17/2014   CLINICAL DATA:  Shortness of breath with fever and chills  EXAM: CHEST  2 VIEW  COMPARISON:  February 25, 2010  FINDINGS: There is no edema or consolidation. The heart size and pulmonary vascularity are normal. No adenopathy. There is evidence of old trauma involving the lateral left clavicle.  IMPRESSION: No edema or consolidation.   Electronically Signed   By: Bretta Bang III M.D.   On: 09/17/2014 14:52    IMPRESSION AND PLAN:  * Acute respiratory failure with hypoxemia We'll provide supplemental oxygen and treat underlying cause of COPD.  * COPD exacerbation IV  steroid, nebulizer treatment, incentive spirometry, oxygen supplementation, oral Levaquin.  * Hypertension  Continue lisinopril and hydrochlorothiazide.  *Smoking Counseled to quit smoking for 4 minutes, she agrees for  that.  * Gastroesophageal reflux disease Continue Protonix.  All the records are reviewed and case discussed with ED provider. Management plans discussed with the patient, family and they are in agreement.  CODE STATUS: Full  TOTAL TIME TAKING CARE OF THIS PATIENT: 50 minutes.    Altamese Dilling M.D on 09/17/2014   Between 7am to 6pm - Pager - [phone]  After 6pm go to [email or web address] - password [email or web address]  [facility]  Office  [phone]  CC: Primary care physician; Pcp Not In System

## 2014-09-17 NOTE — ED Provider Notes (Signed)
Signout from Dr. KinnerCyril Loosen reassess the patient after nebulizer treatments. Physical Exam  BP 121/62 mmHg  Pulse 91  Temp(Src) 98.4 F (36.9 C) (Oral)  Resp 32  SpO2 87%  LMP 06/23/2014 (Approximate)  Physical Exam ----------------------------------------- 4:49 PM on 09/17/2014 -----------------------------------------  Patient without any acute distress. However, decreased air movement throughout with coarse wheezes. 88-90% on 2 L nasal cannula. The patient is normally without any supplemental oxygen at home. We'll admit the patient for further treatment for her obstructive pulmonary disease. Possibly with new onset COPD. ED Course  Procedures  MDM Signed out to Dr. Elisabeth Pigeon.      Myrna Blazer, MD 09/17/14 [phone]

## 2014-09-18 ENCOUNTER — Inpatient Hospital Stay: Payer: No Typology Code available for payment source

## 2014-09-18 ENCOUNTER — Encounter: Payer: Self-pay | Admitting: Radiology

## 2014-09-18 LAB — BASIC METABOLIC PANEL
Anion gap: 8 (ref 5–15)
BUN: 13 mg/dL (ref 6–20)
CALCIUM: 9.3 mg/dL (ref 8.9–10.3)
CO2: 28 mmol/L (ref 22–32)
Chloride: 101 mmol/L (ref 101–111)
Creatinine, Ser: 0.85 mg/dL (ref 0.44–1.00)
Glucose, Bld: 163 mg/dL — ABNORMAL HIGH (ref 65–99)
POTASSIUM: 3.9 mmol/L (ref 3.5–5.1)
SODIUM: 137 mmol/L (ref 135–145)

## 2014-09-18 LAB — CBC
HCT: 42.8 % (ref 35.0–47.0)
HEMOGLOBIN: 14.3 g/dL (ref 12.0–16.0)
MCH: 30.4 pg (ref 26.0–34.0)
MCHC: 33.4 g/dL (ref 32.0–36.0)
MCV: 91 fL (ref 80.0–100.0)
Platelets: 342 10*3/uL (ref 150–440)
RBC: 4.71 MIL/uL (ref 3.80–5.20)
RDW: 13.2 % (ref 11.5–14.5)
WBC: 21.8 10*3/uL — ABNORMAL HIGH (ref 3.6–11.0)

## 2014-09-18 LAB — HEMOGLOBIN A1C: Hgb A1c MFr Bld: 5.6 % (ref 4.0–6.0)

## 2014-09-18 MED ORDER — IOHEXOL 350 MG/ML SOLN
100.0000 mL | Freq: Once | INTRAVENOUS | Status: AC | PRN
  Administered 2014-09-18: 100 mL via INTRAVENOUS

## 2014-09-18 MED ORDER — IOHEXOL 350 MG/ML SOLN
75.0000 mL | Freq: Once | INTRAVENOUS | Status: AC | PRN
  Administered 2014-09-18: 70 mL via INTRAVENOUS

## 2014-09-18 MED ORDER — BUDESONIDE-FORMOTEROL FUMARATE 160-4.5 MCG/ACT IN AERO: 2.0000 | INHALATION_SPRAY | Freq: Two times a day (BID) | RESPIRATORY_TRACT | Status: DC

## 2014-09-18 MED ORDER — TIOTROPIUM BROMIDE MONOHYDRATE 18 MCG IN CAPS
18.0000 ug | ORAL_CAPSULE | Freq: Every day | RESPIRATORY_TRACT | Status: DC
  Administered 2014-09-18 – 2014-09-22 (×5): 18 ug via RESPIRATORY_TRACT
  Filled 2014-09-18: qty 5

## 2014-09-18 MED ORDER — GUAIFENESIN ER 600 MG PO TB12
600.0000 mg | ORAL_TABLET | Freq: Two times a day (BID) | ORAL | Status: DC
  Administered 2014-09-18 – 2014-09-22 (×9): 600 mg via ORAL
  Filled 2014-09-18 (×9): qty 1

## 2014-09-18 MED ORDER — LEVOFLOXACIN IN D5W 500 MG/100ML IV SOLN
500.0000 mg | INTRAVENOUS | Status: DC
Start: 2014-09-18 — End: 2014-09-22
  Administered 2014-09-18 – 2014-09-21 (×4): 500 mg via INTRAVENOUS
  Filled 2014-09-18 (×5): qty 100

## 2014-09-18 NOTE — Care Management (Addendum)
Patient admitted with Acute respiratory failure with hypoxemia, History of COPD.  Patient is a new patient at [facility].  Patient states that she normally uses the pharmacy at [facility] on [address].  Patient states she also has access to discounted medications at [facility].  Patient lives at home with family.  Patient currently on 4L of O2, does not have a chronic O2 use at home.  Patient has access to transportation to follow up appointments.  Plan for RN to wean O2 as tolerated.  If home O2 required at discharge patient must have qualifying O2 Sats and diagnosis.  RN CM to follow.

## 2014-09-18 NOTE — Progress Notes (Signed)
[facility] - [place] at [facility]                                                                                                                                                                                            Patient Demographics   Madeline Church, is a 49 y.o. female, DOB - 12-31-1965, ZOX:[id number]  Admit date - 09/17/2014   Admitting Physician Altamese Dilling, MD  Outpatient Primary MD for the patient is Pcp Not In System   LOS - 1  Subjective: Patient's breathing is somewhat improved she is on 4 L of oxygen. She does have history of 30 pack years but reports that she has not had shortness of breath since recently. She is able to cough up some of the sputum but some is not able to come out.     Review of Systems:   CONSTITUTIONAL: No documented fever. No fatigue, weakness. No weight gain, no weight loss.  EYES: No blurry or double vision.  ENT: No tinnitus. No postnasal drip. No redness of the oropharynx.  RESPIRATORY: Positive cough, positive wheeze, no hemoptysis. Positive dyspnea.  CARDIOVASCULAR: No chest pain. No orthopnea. No palpitations. No syncope.  GASTROINTESTINAL: No nausea, no vomiting or diarrhea. No abdominal pain. No melena or hematochezia.  GENITOURINARY: No dysuria or hematuria.  ENDOCRINE: No polyuria or nocturia. No heat or cold intolerance.  HEMATOLOGY: No anemia. No bruising. No bleeding.  INTEGUMENTARY: No rashes. No lesions.  MUSCULOSKELETAL: No arthritis. No swelling. No gout.  NEUROLOGIC: No numbness, tingling, or ataxia. No seizure-type activity.  PSYCHIATRIC: No anxiety. No insomnia. No ADD.    Vitals:   Filed Vitals:   09/17/14 2303 09/18/14 0723 09/18/14 0754 09/18/14 1110  BP: 112/66  116/59   Pulse: 112  109   Temp: 98.1 F (36.7 C)  98.4 F (36.9 C)   TempSrc: Oral  Oral   Resp: 20  17   Height:      Weight:      SpO2: 90% 90% 92% 91%    Wt Readings from Last 3 Encounters:  09/17/14 132.768  kg (292 lb 11.2 oz)  09/01/14 135.314 kg (298 lb 5 oz)  10/16/12 123.378 kg (272 lb)     Intake/Output Summary (Last 24 hours) at 09/18/14 1156 Last data filed at 09/18/14 0745  Gross per 24 hour  Intake    480 ml  Output      0 ml  Net    480 ml    Physical Exam:   GENERAL: Pleasant-appearing in no apparent distress.  HEAD, EYES, EARS, NOSE AND THROAT: Atraumatic, normocephalic. Extraocular muscles  are intact. Pupils equal and reactive to light. Sclerae anicteric. No conjunctival injection. No oro-pharyngeal erythema.  NECK: Supple. There is no jugular venous distention. No bruits, no lymphadenopathy, no thyromegaly.  HEART: Regular rate and rhythm,. No murmurs, no rubs, no clicks.  LUNGS: Bilateral wheezing without any rales rhonchi no accesory muscle usage  ABDOMEN: Soft, flat, nontender, nondistended. Has good bowel sounds. No hepatosplenomegaly appreciated.  EXTREMITIES: No evidence of any cyanosis, clubbing, or peripheral edema.  +2 pedal and radial pulses bilaterally.  NEUROLOGIC: The patient is alert, awake, and oriented x3 with no focal motor or sensory deficits appreciated bilaterally.  SKIN: Moist and warm with no rashes appreciated.  Psych: Not anxious, depressed LN: No inguinal LN enlargement    Antibiotics   Anti-infectives    Start     Dose/Rate Route Frequency Ordered Stop   09/17/14 1715  levofloxacin (LEVAQUIN) tablet 500 mg     500 mg Oral Daily 09/17/14 1705        Medications   Scheduled Meds: . budesonide (PULMICORT) nebulizer solution  0.25 mg Nebulization BID  . guaiFENesin  600 mg Oral BID  . heparin  5,000 Units Subcutaneous 3 times per day  . lisinopril  20 mg Oral Daily   And  . hydrochlorothiazide  12.5 mg Oral Daily  . ipratropium-albuterol  3 mL Nebulization Q4H  . levofloxacin  500 mg Oral Daily  . loratadine  10 mg Oral Daily  . methylPREDNISolone (SOLU-MEDROL) injection  60 mg Intravenous Q6H  . pantoprazole  40 mg Oral Daily  .  tiotropium  18 mcg Inhalation Daily   Continuous Infusions:  PRN Meds:.   Data Review:   Micro Results No results found for this or any previous visit (from the past 240 hour(s)).  Radiology Reports Dg Chest 2 View  09/17/2014   CLINICAL DATA:  Shortness of breath with fever and chills  EXAM: CHEST  2 VIEW  COMPARISON:  February 25, 2010  FINDINGS: There is no edema or consolidation. The heart size and pulmonary vascularity are normal. No adenopathy. There is evidence of old trauma involving the lateral left clavicle.  IMPRESSION: No edema or consolidation.   Electronically Signed   By: Bretta Bang III M.D.   On: 09/17/2014 14:52   US Venous Img Lower Unilateral Left  09/01/2014   CLINICAL DATA:  Left leg pain, edema.  EXAM: LEFT LOWER EXTREMITY VENOUS DOPPLER ULTRASOUND  TECHNIQUE: Gray-scale sonography with compression, as well as color and duplex ultrasound, were performed to evaluate the deep venous system from the level of the common femoral vein through the popliteal and proximal calf veins.  COMPARISON:  None  FINDINGS: Normal compressibility of the common femoral, superficial femoral, and popliteal veins, as well as the proximal calf veins. No filling defects to suggest DVT on grayscale or color Doppler imaging. Doppler waveforms show normal direction of venous flow, normal respiratory phasicity and response to augmentation. Survey views of the contralateral common femoral vein are unremarkable.  IMPRESSION: 1. No evidence of lower extremity deep vein thrombosis, LEFT.   Electronically Signed   By: Corlis Leak M.D.   On: 09/01/2014 18:55     CBC  Recent Labs Lab 09/17/14 1400 09/18/14 0510  WBC 16.1* 21.8*  HGB 14.4 14.3  HCT 43.3 42.8  PLT 323 342  MCV 90.1 91.0  MCH 29.9 30.4  MCHC 33.2 33.4  RDW 13.2 13.2    Chemistries   Recent Labs Lab 09/17/14 1400 09/18/14 0510  NA 138 137  K 4.1 3.9  CL 102 101  CO2 27 28  GLUCOSE 96 163*  BUN 14 13  CREATININE 0.82  0.85  CALCIUM 8.9 9.3  AST 21  --   ALT 20  --   ALKPHOS 75  --   BILITOT 0.6  --    ------------------------------------------------------------------------------------------------------------------ estimated creatinine clearance is 116.4 mL/min (by C-G formula based on Cr of 0.85). ------------------------------------------------------------------------------------------------------------------ No results for input(s): HGBA1C in the last 72 hours. ------------------------------------------------------------------------------------------------------------------ No results for input(s): CHOL, HDL, LDLCALC, TRIG, CHOLHDL, LDLDIRECT in the last 72 hours. ------------------------------------------------------------------------------------------------------------------ No results for input(s): TSH, T4TOTAL, T3FREE, THYROIDAB in the last 72 hours.  Invalid input(s): FREET3 ------------------------------------------------------------------------------------------------------------------ No results for input(s): VITAMINB12, FOLATE, FERRITIN, TIBC, IRON, RETICCTPCT in the last 72 hours.  Coagulation profile No results for input(s): INR, PROTIME in the last 168 hours.  No results for input(s): DDIMER in the last 72 hours.  Cardiac Enzymes  Recent Labs Lab 09/17/14 1400  TROPONINI <0.03   ------------------------------------------------------------------------------------------------------------------ Invalid input(s): POCBNP    Assessment & Plan   * Acute respiratory failure with hypoxemia Chest x-ray is negative Due to acute COPD exasperation no history of this in the past Continue Pulmicort, nebulizers, Levaquin, IV Solu-Medrol. I will add Mucinex and Mucomyst to her current regimen Also rule out PE with CT per PE due to 4 L of oxygen requirement on patient was not on any oxygen with a negative chest x-ray  * COPD exacerbation IV steroid, nebulizer treatment, incentive  spirometry, oxygen supplementation, oral Levaquin.  * Hypertension Continue lisinopril and hydrochlorothiazide.  * Leukocytosis due to steroids  *Smoking Counseled to quit smoking by admitting physician  * Gastroesophageal reflux disease Continue Protonix.      Code Status Orders        Start     Ordered   09/17/14 1843  Full code   Continuous     09/17/14 1842           Consults  none DVT Prophylaxis heparin  Lab Results  Component Value Date   PLT 342 09/18/2014     Time Spent in minutes   35 minutes  Greater than 50% of time spent in care coordination and counseling.   Auburn Bilberry M.D on 09/18/2014 at 11:56 AM  Between 7am to 6pm - Pager - [phone]  After 6pm go to [email or web address] - password [email or web address] [facility]  [facility]   Office  [phone]

## 2014-09-19 MED ORDER — METHYLPREDNISOLONE SODIUM SUCC 40 MG IJ SOLR
40.0000 mg | Freq: Three times a day (TID) | INTRAMUSCULAR | Status: DC
  Administered 2014-09-19 – 2014-09-22 (×8): 40 mg via INTRAVENOUS
  Filled 2014-09-19 (×9): qty 1

## 2014-09-19 NOTE — Progress Notes (Signed)
Patient ID: Madeline Church, female   DOB: [birth year], 49 y.o.   MRN: [id number] [facility] PROGRESS NOTE  [facility]  HPI/Subjective: The patient is feeling better than when she came in. Has not slept in days. Still with cough. Still with shortness of breath. Complains also of headache.  Objective: Filed Vitals:   09/19/14 0900  BP: 113/62  Pulse:   Temp:   Resp:     Filed Weights   09/17/14 1827  Weight: 132.768 kg (292 lb 11.2 oz)    ROS: Review of Systems  Constitutional: Negative for fever and chills.  Eyes: Negative for blurred vision.  Respiratory: Positive for cough and shortness of breath. Negative for hemoptysis.   Cardiovascular: Negative for chest pain.  Gastrointestinal: Negative for nausea, vomiting, abdominal pain, diarrhea and constipation.  Genitourinary: Negative for dysuria.  Musculoskeletal: Negative for joint pain.  Neurological: Positive for headaches. Negative for dizziness.   Exam: Physical Exam  Constitutional: She is oriented to person, place, and time.  HENT:  Nose: No mucosal edema.  Mouth/Throat: No oropharyngeal exudate, posterior oropharyngeal edema or posterior oropharyngeal erythema.  Eyes: Conjunctivae, EOM and lids are normal. Pupils are equal, round, and reactive to light.  Neck: No JVD present. Carotid bruit is not present. No edema present. No thyroid mass and no thyromegaly present.  Cardiovascular: Regular rhythm, S1 normal and S2 normal.  Exam reveals no gallop.   No murmur heard. Pulses:      Dorsalis pedis pulses are 2+ on the right side, and 2+ on the left side.  Respiratory: No accessory muscle usage. No respiratory distress. She has decreased breath sounds in the right upper field, the right middle field, the right lower field, the left upper field, the left middle field and the left lower field. She has wheezes in the right lower field and the left lower field. She has no rhonchi. She has no rales.  GI:  Soft. Bowel sounds are normal. There is no tenderness.  Musculoskeletal:       Right ankle: She exhibits swelling.       Left ankle: She exhibits swelling.  Lymphadenopathy:    She has no cervical adenopathy.  Neurological: She is alert and oriented to person, place, and time. No cranial nerve deficit.  Skin: Skin is warm. No rash noted. Nails show no clubbing.  Psychiatric: She has a normal mood and affect.    Data Reviewed: Basic Metabolic Panel:  Recent Labs Lab 09/17/14 1400 09/18/14 0510  NA 138 137  K 4.1 3.9  CL 102 101  CO2 27 28  GLUCOSE 96 163*  BUN 14 13  CREATININE 0.82 0.85  CALCIUM 8.9 9.3   Liver Function Tests:  Recent Labs Lab 09/17/14 1400  AST 21  ALT 20  ALKPHOS 75  BILITOT 0.6  PROT 7.4  ALBUMIN 3.5   CBC:  Recent Labs Lab 09/17/14 1400 09/18/14 0510  WBC 16.1* 21.8*  HGB 14.4 14.3  HCT 43.3 42.8  MCV 90.1 91.0  PLT 323 342    Studies: Dg Chest 2 View  09/17/2014   CLINICAL DATA:  Shortness of breath with fever and chills  EXAM: CHEST  2 VIEW  COMPARISON:  February 25, 2010  FINDINGS: There is no edema or consolidation. The heart size and pulmonary vascularity are normal. No adenopathy. There is evidence of old trauma involving the lateral left clavicle.  IMPRESSION: No edema or consolidation.   Electronically Signed   By: Bretta Bang  III M.D.   On: 09/17/2014 14:52   Ct Angio Chest Pe W/cm &/or Wo Cm  09/18/2014   CLINICAL DATA:  49 year old female with acute shortness of Breath. Smoker. Initial encounter.  EXAM: CT ANGIOGRAPHY CHEST WITH CONTRAST  TECHNIQUE: Multidetector CT imaging of the chest was performed using the standard protocol during bolus administration of intravenous contrast. Multiplanar CT image reconstructions and MIPs were obtained to evaluate the vascular anatomy.  CONTRAST:  170 mL Omnipaque 350 (initial suboptimal contrast bolus)  COMPARISON:  Chest radiographs 09/17/2014 and earlier.  FINDINGS: Initially poor,  ultimately adequate contrast bolus timing in the pulmonary arterial tree. The central pulmonary arteries are patent. Mild motion artifact at the left lung base. The left side pulmonary arterial tree appears patent. On the right side there is moderate elevation of the right hemidiaphragm, somewhat distorting the course of the right lower lobe pulmonary arteries. There is no convincing right pulmonary artery filling defect.  Tree-in-bud nodular pulmonary opacity throughout the lungs. Right middle lobe and lower lobe confluent opacity adjacent to the elevated right hemidiaphragm which most resembles a combination of atelectasis and mild consolidation, with some air bronchograms. No pleural effusion. No confluent left lung opacity. There is increased right hilar and subcarinal soft tissue compatible with lymphadenopathy. This appears reactive in nature. Mildly increased number of central mediastinal lymph nodes as well.  Negative thoracic inlet. No axillary lymphadenopathy. Negative visualized aorta. No pericardial effusion. Negative visualized spleen, adrenal glands, and bowel in the upper abdomen. Decreased density in the visualized liver.  No acute osseous abnormality identified.  Review of the MIP images confirms the above findings.  IMPRESSION: 1.  No evidence of acute pulmonary embolus. 2. Diffuse bilateral pulmonary tree-in-bud nodular opacity compatible with bilateral pulmonary (distal airway) infection. Acute on chronic elevation of the right hemidiaphragm with mild right middle lobe and lower lobe consolidation and atelectasis. 3. No pleural effusion. Reactive appearing right hilar and central mediastinal lymph nodes.   Electronically Signed   By: Odessa Fleming M.D.   On: 09/18/2014 13:41    Scheduled Meds: . budesonide (PULMICORT) nebulizer solution  0.25 mg Nebulization BID  . guaiFENesin  600 mg Oral BID  . heparin  5,000 Units Subcutaneous 3 times per day  . lisinopril  20 mg Oral Daily   And  .  hydrochlorothiazide  12.5 mg Oral Daily  . ipratropium-albuterol  3 mL Nebulization Q4H  . levofloxacin (LEVAQUIN) IV  500 mg Intravenous Q24H  . loratadine  10 mg Oral Daily  . methylPREDNISolone (SOLU-MEDROL) injection  60 mg Intravenous Q6H  . pantoprazole  40 mg Oral Daily  . tiotropium  18 mcg Inhalation Daily    Assessment/Plan:  1. Acute respiratory failure with hypoxia. Patient currently on 4 L of humidified oxygen in order to oxygenate. Try to taper down oxygen as tolerated.  2. COPD exacerbation- since the patient is not sleeping I will decrease Solu-Medrol to 40 mg every 8 hours from high-dose Solu-Medrol. Continue budesonide nebulizers. 3. Pneumonia in tree-in-bud opacifications in distal airways. Patient started on Levaquin yesterday I will continue. 4. Essential hypertension continue lisinopril and hydrochlorothiazide. 5. Gastroesophageal reflux disease without esophagitis on omeprazole at home. Continue Protonix while here.  Code Status:     Code Status Orders        Start     Ordered   09/17/14 1843  Full code   Continuous     09/17/14 1842     Disposition Plan: Home once better. I would  like to get off oxygen prior to disposition.  Antibiotics:  Levaquin  Time spent: 28 minutes  Alford Highland  [facility]

## 2014-09-20 NOTE — Progress Notes (Signed)
Patient ID: Madeline Church, female   DOB: 1965-03-16, 49 y.o.   MRN: [id number]  [facility] PROGRESS NOTE  [facility]  HPI/Subjective: Patient still with cough and shortness of breath. When I walked in the room still on 4 L of oxygen. She feels a little bit better today. She was able to sleep a little bit last night and again this morning.  Objective: Filed Vitals:   09/20/14 0801  BP: 102/51  Pulse: 65  Temp: 97.4 F (36.3 C)  Resp: 15    Filed Weights   09/17/14 1827  Weight: 132.768 kg (292 lb 11.2 oz)    ROS: Review of Systems  Constitutional: Negative for fever and chills.  Eyes: Negative for blurred vision.  Respiratory: Positive for cough and shortness of breath. Negative for hemoptysis.   Cardiovascular: Negative for chest pain.  Gastrointestinal: Negative for nausea, vomiting, abdominal pain, diarrhea and constipation.  Genitourinary: Negative for dysuria.  Musculoskeletal: Negative for joint pain.  Neurological: Positive for headaches. Negative for dizziness.   Exam: Physical Exam  Constitutional: She is oriented to person, place, and time.  HENT:  Nose: No mucosal edema.  Mouth/Throat: No oropharyngeal exudate, posterior oropharyngeal edema or posterior oropharyngeal erythema.  Eyes: Conjunctivae, EOM and lids are normal. Pupils are equal, round, and reactive to light.  Neck: No JVD present. Carotid bruit is not present. No edema present. No thyroid mass and no thyromegaly present.  Cardiovascular: Regular rhythm, S1 normal and S2 normal.  Exam reveals no gallop.   No murmur heard. Pulses:      Dorsalis pedis pulses are 2+ on the right side, and 2+ on the left side.  Respiratory: No accessory muscle usage. No respiratory distress. She has decreased breath sounds in the right middle field, the right lower field, the left middle field and the left lower field. She has wheezes in the right lower field and the left lower field. She has no  rhonchi. She has no rales.  GI: Soft. Bowel sounds are normal. There is no tenderness.  Musculoskeletal:       Right ankle: She exhibits swelling.       Left ankle: She exhibits swelling.  Lymphadenopathy:    She has no cervical adenopathy.  Neurological: She is alert and oriented to person, place, and time. No cranial nerve deficit.  Skin: Skin is warm. No rash noted. Nails show no clubbing.  Psychiatric: She has a normal mood and affect.    Data Reviewed: Basic Metabolic Panel:  Recent Labs Lab 09/17/14 1400 09/18/14 0510  NA 138 137  K 4.1 3.9  CL 102 101  CO2 27 28  GLUCOSE 96 163*  BUN 14 13  CREATININE 0.82 0.85  CALCIUM 8.9 9.3   Liver Function Tests:  Recent Labs Lab 09/17/14 1400  AST 21  ALT 20  ALKPHOS 75  BILITOT 0.6  PROT 7.4  ALBUMIN 3.5   CBC:  Recent Labs Lab 09/17/14 1400 09/18/14 0510  WBC 16.1* 21.8*  HGB 14.4 14.3  HCT 43.3 42.8  MCV 90.1 91.0  PLT 323 342   Scheduled Meds: . budesonide (PULMICORT) nebulizer solution  0.25 mg Nebulization BID  . guaiFENesin  600 mg Oral BID  . heparin  5,000 Units Subcutaneous 3 times per day  . lisinopril  20 mg Oral Daily   And  . hydrochlorothiazide  12.5 mg Oral Daily  . ipratropium-albuterol  3 mL Nebulization Q4H  . levofloxacin (LEVAQUIN) IV  500 mg Intravenous  Q24H  . loratadine  10 mg Oral Daily  . methylPREDNISolone (SOLU-MEDROL) injection  40 mg Intravenous 3 times per day  . pantoprazole  40 mg Oral Daily  . tiotropium  18 mcg Inhalation Daily    Assessment/Plan:  1. Acute respiratory failure with hypoxia. Patient currently on 4 L of humidified oxygen in order to oxygenate. I turn the patient down to 3 L. We'll check room air saturation tomorrow. 2. COPD exacerbation- continue Solu-Medrol to 40 mg every 8 hours. Continue budesonide nebulizers. 3. Pneumonia in tree-in-bud opacifications in distal airways. Continue Levaquin. 4. Essential hypertension continue lisinopril and  hydrochlorothiazide. 5. Gastroesophageal reflux disease without esophagitis on omeprazole at home. Continue Protonix while here.  Code Status:     Code Status Orders        Start     Ordered   09/17/14 1843  Full code   Continuous     09/17/14 1842     Disposition Plan: Home once better. I would like to get off oxygen prior to disposition.  Antibiotics:  Levaquin  Time spent: 22 minutes  Alford Highland  [facility]

## 2014-09-21 NOTE — Progress Notes (Signed)
Patient ID: Madeline Church, female   DOB: Jan 10, 1966, 49 y.o.   MRN: [id number]  [facility] PROGRESS NOTE  [facility]  HPI/Subjective: Patient finally starting to feel better. She is breathing a little bit easier. Her room air oxygenation at rest was normal but did drop down to 79% with ambulation. She is very tired and sleepy.  Objective: Filed Vitals:   09/21/14 0745  BP: 124/62  Pulse: 70  Temp: 97.9 F (36.6 C)  Resp: 17    Filed Weights   09/17/14 1827  Weight: 132.768 kg (292 lb 11.2 oz)    ROS: Review of Systems  Constitutional: Positive for malaise/fatigue. Negative for fever and chills.  Eyes: Negative for blurred vision.  Respiratory: Positive for cough and shortness of breath. Negative for hemoptysis.   Cardiovascular: Negative for chest pain.  Gastrointestinal: Negative for nausea, vomiting, abdominal pain, diarrhea and constipation.  Genitourinary: Negative for dysuria.  Musculoskeletal: Negative for joint pain.  Neurological: Positive for headaches. Negative for dizziness.   Exam: Physical Exam  Constitutional: She is oriented to person, place, and time.  HENT:  Nose: No mucosal edema.  Mouth/Throat: No oropharyngeal exudate, posterior oropharyngeal edema or posterior oropharyngeal erythema.  Eyes: Conjunctivae, EOM and lids are normal. Pupils are equal, round, and reactive to light.  Neck: No JVD present. Carotid bruit is not present. No edema present. No thyroid mass and no thyromegaly present.  Cardiovascular: Regular rhythm, S1 normal and S2 normal.  Exam reveals no gallop.   No murmur heard. Pulses:      Dorsalis pedis pulses are 2+ on the right side, and 2+ on the left side.  Respiratory: No accessory muscle usage. No respiratory distress. She has decreased breath sounds in the right lower field and the left lower field. She has wheezes in the right lower field and the left lower field. She has no rhonchi. She has no rales.  GI:  Soft. Bowel sounds are normal. There is no tenderness.  Musculoskeletal:       Right ankle: She exhibits swelling.       Left ankle: She exhibits swelling.  Lymphadenopathy:    She has no cervical adenopathy.  Neurological: She is alert and oriented to person, place, and time. No cranial nerve deficit.  Skin: Skin is warm. No rash noted. Nails show no clubbing.  Psychiatric: She has a normal mood and affect.    Data Reviewed: Basic Metabolic Panel:  Recent Labs Lab 09/17/14 1400 09/18/14 0510  NA 138 137  K 4.1 3.9  CL 102 101  CO2 27 28  GLUCOSE 96 163*  BUN 14 13  CREATININE 0.82 0.85  CALCIUM 8.9 9.3   Liver Function Tests:  Recent Labs Lab 09/17/14 1400  AST 21  ALT 20  ALKPHOS 75  BILITOT 0.6  PROT 7.4  ALBUMIN 3.5   CBC:  Recent Labs Lab 09/17/14 1400 09/18/14 0510  WBC 16.1* 21.8*  HGB 14.4 14.3  HCT 43.3 42.8  MCV 90.1 91.0  PLT 323 342   Scheduled Meds: . budesonide (PULMICORT) nebulizer solution  0.25 mg Nebulization BID  . guaiFENesin  600 mg Oral BID  . heparin  5,000 Units Subcutaneous 3 times per day  . lisinopril  20 mg Oral Daily   And  . hydrochlorothiazide  12.5 mg Oral Daily  . ipratropium-albuterol  3 mL Nebulization Q4H  . levofloxacin (LEVAQUIN) IV  500 mg Intravenous Q24H  . loratadine  10 mg Oral Daily  .  methylPREDNISolone (SOLU-MEDROL) injection  40 mg Intravenous 3 times per day  . pantoprazole  40 mg Oral Daily  . tiotropium  18 mcg Inhalation Daily    Assessment/Plan:  1. Acute respiratory failure with hypoxia. Patient dropped down to 79% with ambulation but a room air saturation at rest is above 90%. Patient is moving better air today. Hopefully we'll be able to get off oxygen prior to discharge home. 2. COPD exacerbation- continue Solu-Medrol to 40 mg every 8 hours. Continue budesonide nebulizers. 3. Pneumonia in tree-in-bud opacifications in distal airways. Continue Levaquin. 4. Essential hypertension continue  lisinopril and hydrochlorothiazide. 5. Gastroesophageal reflux disease without esophagitis on omeprazole at home. Continue Protonix while here.  Code Status:     Code Status Orders        Start     Ordered   09/17/14 1843  Full code   Continuous     09/17/14 1842     Disposition Plan: Home once better. I would like to get off oxygen prior to disposition.  Antibiotics:  Levaquin  Time spent: 20 minutes  Alford Highland  [facility]

## 2014-09-22 MED ORDER — TIOTROPIUM BROMIDE MONOHYDRATE 18 MCG IN CAPS: 18.0000 ug | ORAL_CAPSULE | Freq: Every day | RESPIRATORY_TRACT | Status: AC

## 2014-09-22 MED ORDER — PREDNISONE 5 MG PO TABS: ORAL_TABLET | ORAL | Status: AC

## 2014-09-22 MED ORDER — BUDESONIDE-FORMOTEROL FUMARATE 160-4.5 MCG/ACT IN AERO: 2.0000 | INHALATION_SPRAY | Freq: Two times a day (BID) | RESPIRATORY_TRACT | Status: AC

## 2014-09-22 MED ORDER — LEVOFLOXACIN 500 MG PO TABS: 500.0000 mg | ORAL_TABLET | Freq: Every day | ORAL | Status: AC

## 2014-09-22 MED ORDER — ALBUTEROL SULFATE HFA 108 (90 BASE) MCG/ACT IN AERS: 2.0000 | INHALATION_SPRAY | Freq: Four times a day (QID) | RESPIRATORY_TRACT | Status: AC | PRN

## 2014-09-22 NOTE — Progress Notes (Signed)
Alert and oriented. Vss. No signs of acute distress. Order received to dc patient home. Discharge instructions given. Patient verbalized understanding.  

## 2014-09-22 NOTE — Care Management (Signed)
Patient will be discharged today per attending, Dr Renae Gloss. MD feels OK to go without order for 02 due to patient rapid improvement.  No other needs identified. Home with self care.

## 2014-09-22 NOTE — Discharge Summary (Signed)
[facility] - [place] at [facility]   PATIENT NAME: Madeline Church    MR#:  [id number]  DATE OF BIRTH:  14-Mar-1965  DATE OF ADMISSION:  09/17/2014 ADMITTING PHYSICIAN: Altamese Dilling, MD  DATE OF DISCHARGE: 09/22/2014  PRIMARY [facility]ARE PHYSIC[facility]   ADMISSION DIAGNOSIS:  Bronchospasm [J98.01] Bronchitis [J40] Hypoxia [R09.02]  DISCHARGE DIAGNOSIS:  Principal Problem:   Acute respiratory failure Active Problems:   COPD (chronic obstructive pulmonary disease)   COPD exacerbation   SECONDARY DIAGNOSIS:   Past Medical History  Diagnosis Date  . PONV (postoperative nausea and vomiting)     mother  . Anxiety   . Pneumonia     last time 4 years ago  . Sleep apnea   . GERD (gastroesophageal reflux disease)   . Headache(784.0)     migraines  . Fibromyalgia     osteoarthritis  . Arthritis   . COPD (chronic obstructive pulmonary disease)     emphysema 2009    HOSPITAL COURSE:   1. Acute respiratory failure with hypoxia. The patient was on oxygen the entire hospitalization. I was able to get her off the oxygen on room air at rest yesterday with decent saturations. With ambulation she did desaturate yesterday down to 79%. Today upon ambulation she did desaturate to 87- 88% briefly but then was in the 90s. She feels well enough to go home today and would like to go home without oxygen. Since she has improved during the hospitalization with better air entry I believe she can go home without oxygen. 2. COPD exacerbation pneumonia. She was given high-dose IV Solu-Medrol upon admission. I had to decrease the dose of steroids secondary to insomnia. I will prescribe a prednisone taper upon going home. A CT scan was done during the hospitalization at diagnosis pneumonia which was not seen on the chest x-ray. Antibiotics were started at that time. The pneumonia was present on admission but just not seen on the chest x-ray. I will complete a course of  Levaquin upon going home. 3. Essential hypertension can continue her usual medication. 4. Gastroesophageal reflux disease without esophagitis continue omeprazole.  DISCHARGE CONDITIONS:   Satisfactory  CONSULTS OBTAINED:  None  DRUG ALLERGIES:  No Known Allergies  DISCHARGE MEDICATIONS:   Current Discharge Medication List    START taking these medications   Details  albuterol (PROVENTIL HFA;VENTOLIN HFA) 108 (90 BASE) MCG/ACT inhaler Inhale 2 puffs into the lungs every 6 (six) hours as needed for wheezing or shortness of breath. Qty: 1 Inhaler, Refills: 2    budesonide-formoterol (SYMBICORT) 160-4.5 MCG/ACT inhaler Inhale 2 puffs into the lungs 2 (two) times daily. Qty: 1 Inhaler, Refills: 12    levofloxacin (LEVAQUIN) 500 MG tablet Take 1 tablet (500 mg total) by mouth daily. Qty: 7 tablet, Refills: 0    predniSONE (DELTASONE) 5 MG tablet Take 5 tabs po day1; 4 tabs po day2; 3 tabs po day 3; 2 tabs po day4; 1 tab po day5,6 Qty: 16 tablet, Refills: 0    tiotropium (SPIRIVA) 18 MCG inhalation capsule Place 1 capsule (18 mcg total) into inhaler and inhale daily. Qty: 30 capsule, Refills: 12      CONTINUE these medications which have NOT CHANGED   Details  cetirizine (ZYRTEC) 10 MG tablet Take 10 mg by mouth at bedtime.    lisinopril-hydrochlorothiazide (PRINZIDE,ZESTORETIC) 20-12.5 MG per tablet Take 1 tablet by mouth at bedtime.    omeprazole (PRILOSEC) 20 MG capsule Take 20 mg by mouth at bedtime.  DISCHARGE INSTRUCTIONS:   Follow-up with the [place] [facility] 1 week.  If you experience worsening of your admission symptoms, develop shortness of breath, life threatening emergency, suicidal or homicidal thoughts you must seek medical attention immediately by calling 911 or calling your MD immediately  if symptoms less severe.  You Must read complete instructions/literature along with all the possible adverse reactions/side effects for all the Medicines you take  and that have been prescribed to you. Take any new Medicines after you have completely understood and accept all the possible adverse reactions/side effects.   Please note  You were cared for by a hospitalist during your hospital stay. If you have any questions about your discharge medications or the care you received while you were in the hospital after you are discharged, you can call the unit and asked to speak with the hospitalist on call if the hospitalist that took care of you is not available. Once you are discharged, your primary care physician will handle any further medical issues. Please note that NO REFILLS for any discharge medications will be authorized once you are discharged, as it is imperative that you return to your primary care physician (or establish a relationship with a primary care physician if you do not have one) for your aftercare needs so that they can reassess your need for medications and monitor your lab values.    Today   CHIEF COMPLAINT:   Chief Complaint  Patient presents with  . Shortness of Breath    HISTORY OF PRESENT ILLNESS:  Madeline Church  is a 49 y.o. female with a known history of hypertension. She presents with shortness of breath and was admitted with acute respiratory failure and COPD exacerbation   VITAL SIGNS:  Blood pressure 110/52, pulse 137, temperature 97.4 F (36.3 C), temperature source Oral, resp. rate 20, height  (1.702 m), weight 132.768 kg (292 lb 11.2 oz), last menstrual period 06/23/2014, SpO2 91 %.    PHYSICAL EXAMINATION:  GENERAL:  49 y.o.-year-old patient lying in the bed with no acute distress.  EYES: Pupils equal, round, reactive to light and accommodation. No scleral icterus. Extraocular muscles intact.  HEENT: Head atraumatic, normocephalic. Oropharynx and nasopharynx clear.  NECK:  Supple, no jugular venous distention. No thyroid enlargement, no tenderness.  LUNGS: Decreased breath sounds bilaterally, no  wheezing, rales,rhonchi or crepitation. No use of accessory muscles of respiration.  CARDIOVASCULAR: S1, S2 normal. No murmurs, rubs, or gallops.  ABDOMEN: Soft, non-tender, non-distended. Bowel sounds present. No organomegaly or mass.  EXTREMITIES: No pedal edema, cyanosis, or clubbing.  NEUROLOGIC: Cranial nerves II through XII are intact. Muscle strength 5/5 in all extremities. Sensation intact. Gait not checked.  PSYCHIATRIC: The patient is alert and oriented x 3.  SKIN: No obvious rash, lesion, or ulcer.   DATA REVIEW:   CBC  Recent Labs Lab 09/18/14 0510  WBC 21.8*  HGB 14.3  HCT 42.8  PLT 342    Chemistries   Recent Labs Lab 09/17/14 1400 09/18/14 0510  NA 138 137  K 4.1 3.9  CL 102 101  CO2 27 28  GLUCOSE 96 163*  BUN 14 13  CREATININE 0.82 0.85  CALCIUM 8.9 9.3  AST 21  --   ALT 20  --   ALKPHOS 75  --   BILITOT 0.6  --     Cardiac Enzymes  Recent Labs Lab 09/17/14 1400  TROPONINI <0.03    Management plans discussed with the patient, family and they are in  agreement.  CODE STATUS:     Code Status Orders        Start     Ordered   09/17/14 1843  Full code   Continuous     09/17/14 1842      TOTAL TIME TAKING CARE OF THIS PATIENT: 40 minutes.    Alford Highland M.D on 09/22/2014 at 11:38 AM  Between 7am to 6pm - Pager - [phone]  After 6pm go to [email or web address] - password [email or web address] [facility]  [facility] [place] [facility]  Office  [phone]  CC: Primary care physician; [facility]

## 2014-09-22 NOTE — Progress Notes (Signed)
Patient ID: Madeline Church, female   DOB: [birth year], 49 y.o.   MRN: [id number] [facility] - [place] at [name] was admitted to the Hospital on 09/17/2014 and Discharged  09/22/2014 and should be excused from work/school   for 7 days starting 09/22/2014, may return to work/school without any restrictions.  Alford Highland M.D on 09/22/2014,at 11:45 AM  [facility] - [place] at [facility]  [phone]

## 2016-01-03 IMAGING — CT CT ANGIO CHEST
2 of 4 series · 13 of 31 positions shown · IV contrast (omnipaque)
Comparison: Chest radiographs 09/17/2014 and earlier.

CLINICAL DATA: 49-year-old female with acute shortness of Breath.
Smoker. Initial encounter.

EXAM:
CT ANGIOGRAPHY CHEST WITH CONTRAST
TECHNIQUE: Multidetector CT imaging of the chest was performed using the
standard protocol during bolus administration of intravenous
contrast. Multiplanar CT image reconstructions and MIPs were
obtained to evaluate the vascular anatomy.
CONTRAST:  170 mL Omnipaque 350 (initial suboptimal contrast bolus)

[Series 5: pe 2.0 · axial · 0.68mm/px · z∈[+262,+406]mm · 4 of 145 slices shown]
[im 37/145  lung]
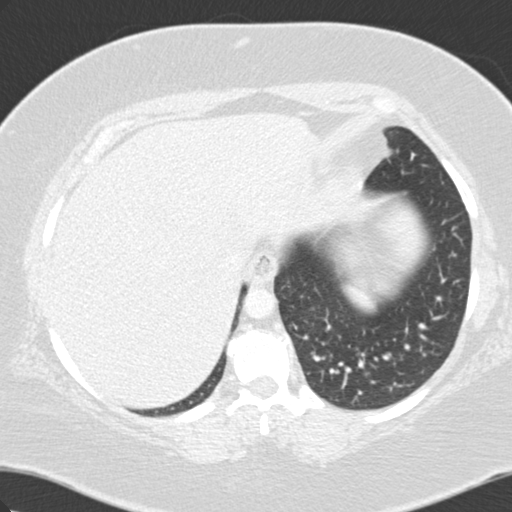
[im 68/145  lung]
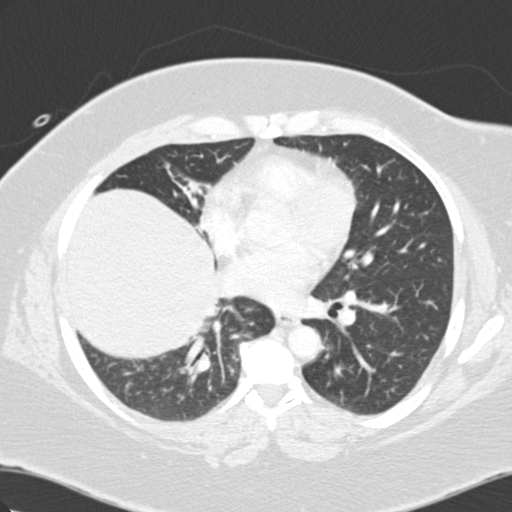
[im 73/145  lung]
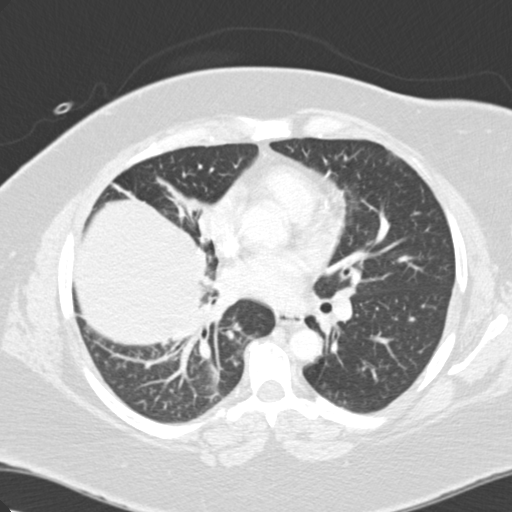
[im 109/145  lung]
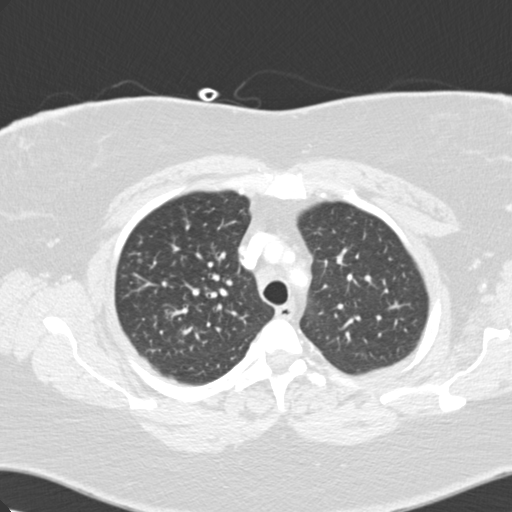

[Series 6: pe 1.0 thins · axial · 0.68mm/px · z∈[+222,+446]mm · 9 of 290 slices shown]
[im 33/290  lung]
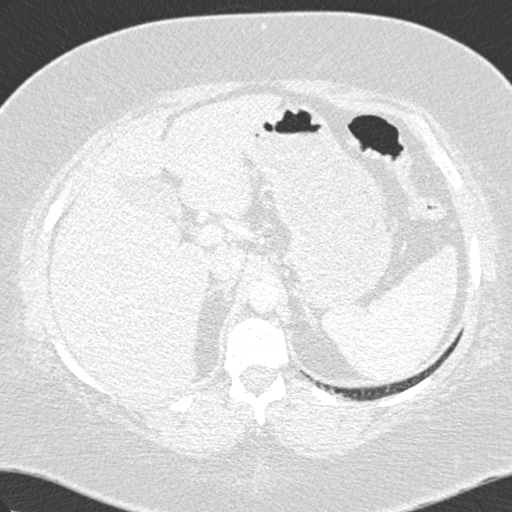
[im 65/290  mediastinal]
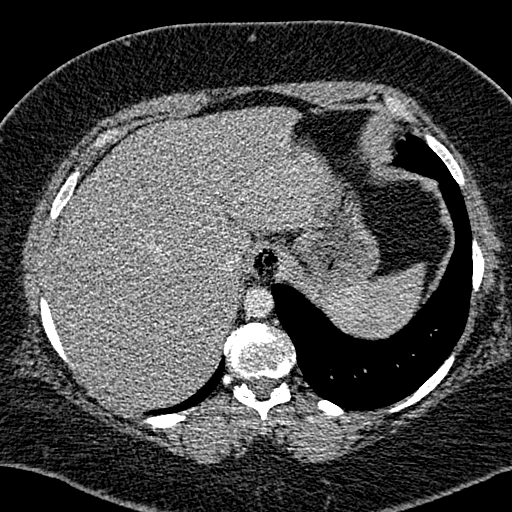
[im 97/290  lung]
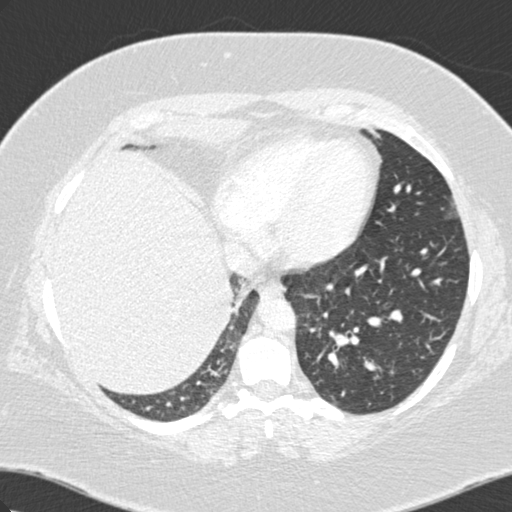
[im 129/290  mediastinal]
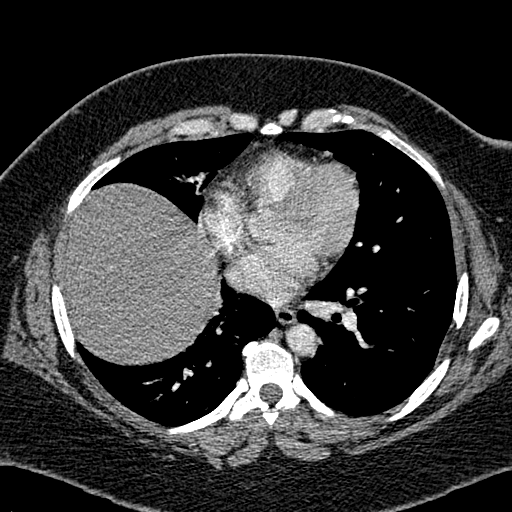
[im 136/290  lung]
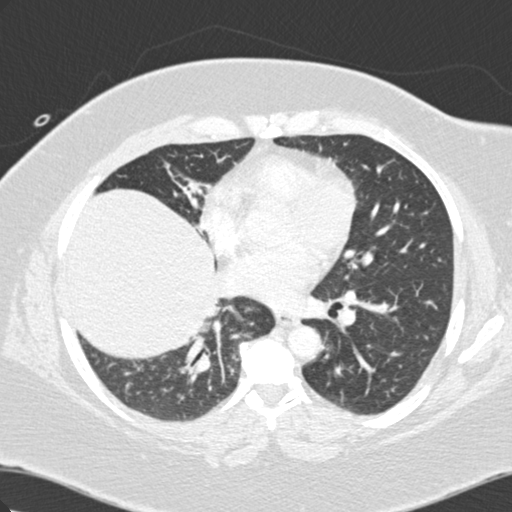
[im 161/290  mediastinal]
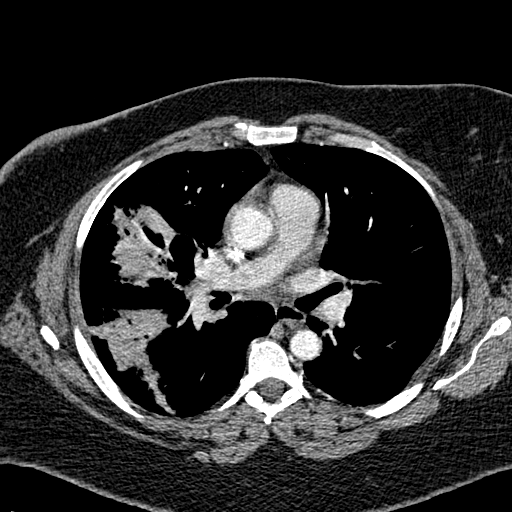
[im 193/290  lung]
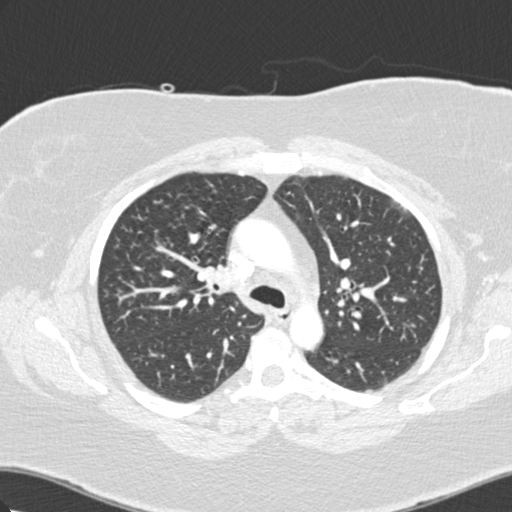
[im 225/290  mediastinal]
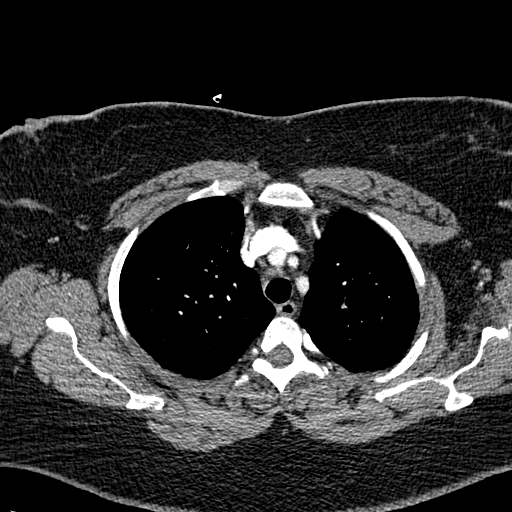
[im 257/290  lung]
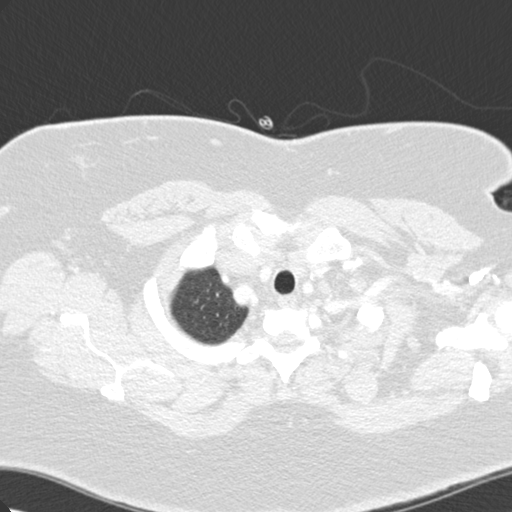

[13 of 31 positions shown; findings below may reference images not displayed]

FINDINGS: Initially poor, ultimately adequate contrast bolus timing in the
pulmonary arterial tree. The central pulmonary arteries are patent.
Mild motion artifact at the left lung base. The left side pulmonary
arterial tree appears patent. On the right side there is moderate
elevation of the right hemidiaphragm, somewhat distorting the course
of the right lower lobe pulmonary arteries. There is no convincing
right pulmonary artery filling defect.

Tree-in-bud nodular pulmonary opacity throughout the lungs. Right
middle lobe and lower lobe confluent opacity adjacent to the
elevated right hemidiaphragm which most resembles a combination of
atelectasis and mild consolidation, with some air bronchograms. No
pleural effusion. No confluent left lung opacity. There is increased
right hilar and subcarinal soft tissue compatible with
lymphadenopathy. This appears reactive in nature. Mildly increased
number of central mediastinal lymph nodes as well.

Negative thoracic inlet. No axillary lymphadenopathy. Negative
visualized aorta. No pericardial effusion. Negative visualized
spleen, adrenal glands, and bowel in the upper abdomen. Decreased
density in the visualized liver.

No acute osseous abnormality identified.

Review of the MIP images confirms the above findings.
IMPRESSION: 1.  No evidence of acute pulmonary embolus.
2. Diffuse bilateral pulmonary tree-in-bud nodular opacity
compatible with bilateral pulmonary (distal airway) infection. Acute
on chronic elevation of the right hemidiaphragm with mild right
middle lobe and lower lobe consolidation and atelectasis.
3. No pleural effusion. Reactive appearing right hilar and central
mediastinal lymph nodes.
